# Patient Record
Sex: Female | Born: 1980 | Race: White | Hispanic: No | Marital: Single | Smoking: Never smoker
Health system: Southern US, Community
[De-identification: ages and names within clinical notes are randomized; demographics above are authoritative.]

## PROBLEM LIST (undated history)

## (undated) DIAGNOSIS — G809 Cerebral palsy, unspecified: Secondary | ICD-10-CM

## (undated) DIAGNOSIS — R569 Unspecified convulsions: Secondary | ICD-10-CM

## (undated) HISTORY — PX: DENTAL SURGERY: SHX609

---

## 1998-07-26 ENCOUNTER — Emergency Department (HOSPITAL_COMMUNITY): Admission: EM | Admit: 1998-07-26 | Discharge: 1998-07-26 | Payer: Self-pay | Admitting: Emergency Medicine

## 1998-07-26 ENCOUNTER — Encounter: Payer: Self-pay | Admitting: Emergency Medicine

## 1998-07-27 ENCOUNTER — Encounter: Payer: Self-pay | Admitting: Emergency Medicine

## 2000-02-14 ENCOUNTER — Other Ambulatory Visit: Admission: RE | Admit: 2000-02-14 | Discharge: 2000-02-14 | Payer: Self-pay | Admitting: Family Medicine

## 2000-02-21 ENCOUNTER — Inpatient Hospital Stay (HOSPITAL_COMMUNITY): Admission: EM | Admit: 2000-02-21 | Discharge: 2000-02-23 | Payer: Self-pay | Admitting: Family Medicine

## 2003-12-22 ENCOUNTER — Emergency Department (HOSPITAL_COMMUNITY): Admission: EM | Admit: 2003-12-22 | Discharge: 2003-12-22 | Payer: Self-pay | Admitting: Emergency Medicine

## 2004-01-20 ENCOUNTER — Ambulatory Visit (HOSPITAL_COMMUNITY): Admission: RE | Admit: 2004-01-20 | Discharge: 2004-01-20 | Payer: Self-pay | Admitting: Oral Surgery

## 2004-03-03 ENCOUNTER — Ambulatory Visit: Payer: Self-pay | Admitting: Family Medicine

## 2004-09-02 ENCOUNTER — Ambulatory Visit: Payer: Self-pay | Admitting: Family Medicine

## 2005-03-18 ENCOUNTER — Ambulatory Visit: Payer: Self-pay | Admitting: Family Medicine

## 2005-11-30 ENCOUNTER — Ambulatory Visit: Payer: Self-pay | Admitting: Internal Medicine

## 2006-03-22 ENCOUNTER — Ambulatory Visit: Payer: Self-pay | Admitting: Internal Medicine

## 2007-01-05 ENCOUNTER — Encounter: Payer: Self-pay | Admitting: Internal Medicine

## 2007-02-01 ENCOUNTER — Ambulatory Visit: Payer: Self-pay | Admitting: Internal Medicine

## 2007-02-01 DIAGNOSIS — G809 Cerebral palsy, unspecified: Secondary | ICD-10-CM | POA: Insufficient documentation

## 2007-02-01 DIAGNOSIS — G40401 Other generalized epilepsy and epileptic syndromes, not intractable, with status epilepticus: Secondary | ICD-10-CM

## 2007-02-01 DIAGNOSIS — J309 Allergic rhinitis, unspecified: Secondary | ICD-10-CM | POA: Insufficient documentation

## 2007-02-02 ENCOUNTER — Telehealth (INDEPENDENT_AMBULATORY_CARE_PROVIDER_SITE_OTHER): Payer: Self-pay | Admitting: *Deleted

## 2007-02-06 ENCOUNTER — Encounter: Payer: Self-pay | Admitting: Internal Medicine

## 2007-02-20 ENCOUNTER — Encounter: Payer: Self-pay | Admitting: Internal Medicine

## 2007-05-01 ENCOUNTER — Ambulatory Visit: Payer: Self-pay | Admitting: Internal Medicine

## 2007-07-23 ENCOUNTER — Telehealth (INDEPENDENT_AMBULATORY_CARE_PROVIDER_SITE_OTHER): Payer: Self-pay | Admitting: *Deleted

## 2007-10-09 ENCOUNTER — Encounter: Payer: Self-pay | Admitting: Internal Medicine

## 2007-11-06 ENCOUNTER — Telehealth (INDEPENDENT_AMBULATORY_CARE_PROVIDER_SITE_OTHER): Payer: Self-pay | Admitting: *Deleted

## 2007-11-07 ENCOUNTER — Encounter: Payer: Self-pay | Admitting: Internal Medicine

## 2008-02-11 ENCOUNTER — Telehealth (INDEPENDENT_AMBULATORY_CARE_PROVIDER_SITE_OTHER): Payer: Self-pay | Admitting: *Deleted

## 2008-02-15 ENCOUNTER — Telehealth (INDEPENDENT_AMBULATORY_CARE_PROVIDER_SITE_OTHER): Payer: Self-pay | Admitting: *Deleted

## 2008-02-15 ENCOUNTER — Encounter: Payer: Self-pay | Admitting: Internal Medicine

## 2008-02-22 ENCOUNTER — Telehealth (INDEPENDENT_AMBULATORY_CARE_PROVIDER_SITE_OTHER): Payer: Self-pay | Admitting: *Deleted

## 2008-02-26 ENCOUNTER — Ambulatory Visit: Payer: Self-pay | Admitting: Internal Medicine

## 2008-02-26 DIAGNOSIS — D2330 Other benign neoplasm of skin of unspecified part of face: Secondary | ICD-10-CM

## 2008-03-23 ENCOUNTER — Emergency Department (HOSPITAL_BASED_OUTPATIENT_CLINIC_OR_DEPARTMENT_OTHER): Admission: EM | Admit: 2008-03-23 | Discharge: 2008-03-23 | Payer: Self-pay | Admitting: Emergency Medicine

## 2008-03-27 ENCOUNTER — Telehealth (INDEPENDENT_AMBULATORY_CARE_PROVIDER_SITE_OTHER): Payer: Self-pay | Admitting: *Deleted

## 2008-03-31 ENCOUNTER — Encounter: Payer: Self-pay | Admitting: Internal Medicine

## 2008-07-28 ENCOUNTER — Telehealth (INDEPENDENT_AMBULATORY_CARE_PROVIDER_SITE_OTHER): Payer: Self-pay | Admitting: *Deleted

## 2008-08-27 ENCOUNTER — Telehealth (INDEPENDENT_AMBULATORY_CARE_PROVIDER_SITE_OTHER): Payer: Self-pay | Admitting: *Deleted

## 2008-09-29 ENCOUNTER — Telehealth (INDEPENDENT_AMBULATORY_CARE_PROVIDER_SITE_OTHER): Payer: Self-pay | Admitting: *Deleted

## 2008-09-30 ENCOUNTER — Encounter: Payer: Self-pay | Admitting: Internal Medicine

## 2008-11-19 ENCOUNTER — Telehealth (INDEPENDENT_AMBULATORY_CARE_PROVIDER_SITE_OTHER): Payer: Self-pay | Admitting: *Deleted

## 2008-12-29 ENCOUNTER — Telehealth (INDEPENDENT_AMBULATORY_CARE_PROVIDER_SITE_OTHER): Payer: Self-pay | Admitting: *Deleted

## 2009-01-09 ENCOUNTER — Telehealth (INDEPENDENT_AMBULATORY_CARE_PROVIDER_SITE_OTHER): Payer: Self-pay | Admitting: *Deleted

## 2009-06-29 ENCOUNTER — Encounter: Payer: Self-pay | Admitting: Internal Medicine

## 2010-05-06 ENCOUNTER — Emergency Department (HOSPITAL_COMMUNITY)
Admission: EM | Admit: 2010-05-06 | Discharge: 2010-05-06 | Payer: Self-pay | Source: Home / Self Care | Admitting: Emergency Medicine

## 2010-06-22 NOTE — Letter (Signed)
Summary: [facility]  [facility]   Imported By: Lanelle Bal 07/21/2009 10:45:03  _____________________________________________________________________  External Attachment:    Type:   Image     Comment:   External Document

## 2010-08-02 LAB — URINE CULTURE
Colony Count: NO GROWTH
Culture  Setup Time: 201112150840

## 2010-08-02 LAB — VALPROIC ACID LEVEL: Valproic Acid Lvl: 115.9 ug/mL — ABNORMAL HIGH (ref 50.0–100.0)

## 2010-08-02 LAB — BASIC METABOLIC PANEL
BUN: 19 mg/dL (ref 6–23)
CO2: 19 mEq/L (ref 19–32)
Calcium: 9 mg/dL (ref 8.4–10.5)
Chloride: 108 mEq/L (ref 96–112)
Creatinine, Ser: 0.83 mg/dL (ref 0.4–1.2)
GFR calc Af Amer: >60 mL/min (ref >60)
GFR calc non Af Amer: >60 mL/min (ref >60)
Glucose, Bld: 170 mg/dL — ABNORMAL HIGH (ref 70–99)
Potassium: 2.8 mEq/L — ABNORMAL LOW (ref 3.5–5.1)
Sodium: 137 mEq/L (ref 135–145)

## 2010-08-02 LAB — CBC
MCV: 96.1 fL (ref 78.0–100.0)
Platelets: 174 10*3/uL (ref 150–400)
RBC: 3.89 MIL/uL (ref 3.87–5.11)
RDW: 13.6 % (ref 11.5–15.5)
WBC: 10.8 10*3/uL — ABNORMAL HIGH (ref 4.0–10.5)

## 2010-08-02 LAB — DIFFERENTIAL
Basophils Absolute: 0 10*3/uL (ref 0.0–0.1)
Lymphocytes Relative: 5 % — ABNORMAL LOW (ref 12–46)
Lymphs Abs: 0.6 10*3/uL — ABNORMAL LOW (ref 0.7–4.0)
Neutro Abs: 9.2 10*3/uL — ABNORMAL HIGH (ref 1.7–7.7)
Neutrophils Relative %: 85 % — ABNORMAL HIGH (ref 43–77)

## 2010-08-02 LAB — URINALYSIS, ROUTINE W REFLEX MICROSCOPIC
Glucose, UA: NEGATIVE mg/dL
Hgb urine dipstick: NEGATIVE
Ketones, ur: 15 mg/dL — AB
Protein, ur: NEGATIVE mg/dL
pH: 6 (ref 5.0–8.0)

## 2010-10-08 NOTE — Discharge Summary (Signed)
[facility]  Patient:    Michelle Oconnor, Michelle Oconnor                       MRN: [id number] Proc. Date: 02/23/00 Adm. Date:  [id number] Disc. Date: 02/23/00 Attending:  Angelena Sole CC:         Angelena Sole, M.D. [facility]   Discharge Summary  DISCHARGE DIAGNOSES: 1. Oral mucosal inflammation. 2. Dehydration secondary to above. 3. Cerebral palsy with known seizure disorder. 4. Allergic rhinitis.  DISCHARGE MEDICATIONS: 1. Majic mouthwash 5 cc swish and spit q.i.d. 2. She also takes Keppra 500 mg one and a half tablets three times daily. 3. Depakote 125 mg t.i.d. 4. Lorazepam 0.5 mg p.o. q.d. p.r.n. 5. Allegra 60 mg p.o. q.d. p.r.n.  CONDITION ON DISCHARGE:  Improved (the patient needs total care).  FOLLOWUP PLANS:  Dr. Ruthine Dose one to two weeks.  HOSPITAL LABORATORY:  ASO titer 90 (normal 0-199).  Sedimentation rate 25. CMET normal except for a glucose of 121 and a total protein of 8.3.  CBC normal except for a white count of 11.6.  Hemoglobin 13.2, platelet count 289,000.  Valproic acid level 48.3 (therapeutic range 50-100).  HOSPITAL COURSE: #1 - ORAL MUCOSAL INFLAMMATION:  The patient was admitted to the hospital service on February 21, 2000, with oral mucosal and inflammation of the gums. She was unable to eat or drink and was dehydrated.  The patient was admitted for hydration.  Her mouth was treated with Majic mouthwash because of her oral mucosal inflammation that was thought to be related to a recent treatment with doxycycline.  Her symptoms improved on Majic mouthwash.  She was tolerating her diet at the time of discharge.  #2 - GASTROINTESTINAL:  The patient was constipated on admission and was given laxative on admission.  The day of discharge the patient had a bowel movement with some bright red blood per rectum per the nurse and mother.  On examination, there were no external hemorrhoids and no fissure was identified. It is unclear the cause of this.   It may need further evaluation.  I suppose it is possible that the same reactions she had with her oral mucosa from the doxycycline could also have affected her intestinal mucosa.  Her mother will watch this as a outpatient.  If it persists, she will call Dr. Ruthine Dose.  Prior to discharge I will obtain a CBC. DD:  02/23/00 TD:  02/23/00 Job: [id number]

## 2010-10-08 NOTE — Op Note (Signed)
Michelle Oconnor, PROCELL                          ACCOUNT NO.:  [id number]   MEDICAL RECORD NO.:  [id number]                   PATIENT TYPE:  OIB   LOCATION:  2550                                 FACILITY:  [facility]   PHYSICIAN:  Dora Sims, M.D.               DATE OF BIRTH:  1981-01-23   DATE OF PROCEDURE:  01/20/2004  DATE OF DISCHARGE:  01/20/2004                                 OPERATIVE REPORT   PREOPERATIVE DIAGNOSIS:  Periodontal disease, dental decay.   POSTOPERATIVE DIAGNOSIS:  Periodontal disease, dental decay.   OPERATION PERFORMED:  Examination of oral cavity under general anesthesia,  extraction of decayed and periodontally involved teeth.   SURGEON:  1.  Dora Sims, M.D.  2.  Vivianne Spence, D.D.S.   INDICATIONS FOR PROCEDURE:  This is a 30 year old Caucasian with cerebral  palsy.  She has difficulty with the ability to cooperate for dental  examination and dental extraction, so decision was made with mother to bring  the patient to the operating room for evaluation and extraction.   DESCRIPTION OF PROCEDURE:  The patient was maintained n.p.o. the night  before surgery, brought to the operating room, placed in a supine position.  All anesthesia monitors were found to be working appropriately.  The patient  was orotracheally intubated with minimal difficulty.  This was confirmed by  clear bilateral breath sounds as well as positive end tidal CO2.  Once this  was done, Dr. Sudie Bailey did an intraoral evaluation of the patient's existing  dentition and he and I conferred on the extraction of teeth numbers 1, 16,  17, 29, 31, and 32, if all third molars were indeed in their respective  locations.  Dr. Sudie Bailey then left the operating room.  2% lidocaine with  1:100,000 parts epinephrine was injected into the maxillary vestibules in  teeth numbers 1 and 16 areas and bilateral inferior alveolar nerve blocks  were given as well as vestibular infiltration at the number 31,  32 area and  17, 18 area.  On evaluation, it was found that palatally, tooth #1 was  visible. A small flap was elevated off the distal of #2, tooth #1 was  luxated and delivered with forceps.  This area was closed with 3-0 chromic  gut suture.  Attention was turned focused on the lower right quadrant.  Tooth #29 was luxated and delivered with forceps as was tooth #31.  It was  clear from this after removal of the soft tissue col, the crown of tooth  #32, a distal flap was then elevated in the traditional buccal hockey stitch  fashion.  Irrigating hand piece was used to remove buccal cortex and the  tooth was luxated and delivered with forceps.  The follicle was removed and  the flap was closed using interrupted 3-0 chromic gut sutures.  This side  was packed with gauze, the orotracheal tube was switched to the  contralateral side and bite block was placed.  Palpation on the distal of  tooth #15 was tactilely evident that tooth #16 was there.  A distal release  was made.  Tooth #16 was luxated and delivered with forceps and the socket  was closed with 3-0 chromic gut suture.  A similar tactile procedure was  used in this case with the dental syringe needle.  It was tactilely evident  that beneath the gingiva was enamel and not bone.  Distal  buccal hockey  stick flap was elevated exposing tooth #17.  Irrigating handpiece was used  to remove buccal bone.  The tooth was luxated and delivered with forceps.  The follicle was removed and the socket was irrigated and closed with 3-0  chromic gut suture in an interrupted fashion.  These areas were packed with  gauze and the patient's mouth was suctioned of all blood and secretions.  A  [place] sump was passed with minimal blood.  The patient was then allowed to  awaken from general anesthesia.  She was extubated with minimal difficulty  and the packing was removed prior to the patient's waking up due to her  mental status.  The  patient tolerated  the procedure well.  Minimal blood was lost.  No drains  were placed.  Nothing was sent for pathology.  Teeth numbers 1, 16, 17, 29,  31 and 32 were extracted.  She will be followed up in my office, placed on  Vicodin pain medicine elixir as well as Peridex antibiotic mouth rinse, and  seen in approximately  10 days.                                               Dora Sims, M.D.    RJR/MEDQ  D:  01/20/2004  T:  01/20/2004  Job:  [id number]

## 2011-09-07 ENCOUNTER — Encounter (HOSPITAL_COMMUNITY): Payer: Self-pay | Admitting: Pharmacy Technician

## 2011-09-15 NOTE — Consult Note (Signed)
This is a 73 y/0 white female with severe cerebral palsy and seizure disorder.  She is very difficult to examine in the office an is not manageable in the office.  She has presented with an abscessed tooth on the lower right side.  The treatment plan is to remove it under general anesthesia.  At the same time Dr. Vivianne Spence will come in to the OR and see if anything else needs to be done

## 2011-09-19 ENCOUNTER — Encounter (HOSPITAL_COMMUNITY)
Admission: RE | Admit: 2011-09-19 | Discharge: 2011-09-19 | Disposition: A | Discharge status: Home / Self Care | Payer: Medicare Other | Source: Ambulatory Visit | Attending: Oral Surgery | Admitting: Oral Surgery

## 2011-09-19 ENCOUNTER — Encounter (HOSPITAL_COMMUNITY): Payer: Self-pay

## 2011-09-19 HISTORY — DX: Unspecified convulsions: R56.9

## 2011-09-19 HISTORY — DX: Cerebral palsy, unspecified: G80.9

## 2011-09-19 NOTE — H&P (Signed)
Michelle Oconnor is an 31 y.o. female.   Chief Complaint: Abscessed tooth  HPI: recent swelling, pain    CP  No past medical history on file.  No past surgical history on file.  No family history on file. Social History:  does not have a smoking history on file. She does not have any smokeless tobacco history on file. Her alcohol and drug histories not on file.  Allergies:  Allergies  Allergen Reactions  . Doxycycline Hyclate     REACTION: tongue swelling    No prescriptions prior to admission    No results found for this or any previous visit (from the past 48 hour(s)). No results found.  ROS  There were no vitals taken for this visit. Physical Exam  HENT:  Mouth/Throat:       Assessment/Plan Removal of infected tooth and detailed exam with Dr. Vivianne Spence.  Other teeth may be removed at the same time  [place]ia Southgate,JOSEPH L 09/19/2011, 9:10 AM

## 2011-09-19 NOTE — Pre-Procedure Instructions (Signed)
20 Michelle Oconnor  09/19/2011   Your procedure is scheduled on:  09/26/11  Report to [facility] at 530 AM.  Call this number if you have problems the morning of surgery: [phone]   Remember:   Do not eat food:After Midnight.  May have clear liquids: up to 4 Hours before arrival.  Clear liquids include soda, tea, black coffee, apple or grape juice, broth.  Take these medicines the morning of surgery with A SIP OF WATER: zyrtec,depakote,ativan   Do not wear jewelry, make-up or nail polish.  Do not wear lotions, powders, or perfumes. You may wear deodorant.  Do not shave 48 hours prior to surgery.  Do not bring valuables to the hospital.  Contacts, dentures or bridgework may not be worn into surgery.  Leave suitcase in the car. After surgery it may be brought to your room.  For patients admitted to the hospital, checkout time is 11:00 AM the day of discharge.   Patients discharged the day of surgery will not be allowed to drive home.  Name and phone number of your driver: family  Special Instructions: CHG Shower Use Special Wash: 1/2 bottle night before surgery and 1/2 bottle morning of surgery.   Please read over the following fact sheets that you were given: Pain Booklet, Coughing and Deep Breathing and Surgical Site Infection Prevention

## 2011-09-19 NOTE — Progress Notes (Signed)
No lab work necessary per anesthesia.

## 2011-09-19 NOTE — Progress Notes (Signed)
Michelle Oconnor p.a  to review h&p,meds am of surgery and lab.

## 2011-09-19 NOTE — Consult Note (Addendum)
Anesthesia Consult:  Patient is a 31 year old female scheduled for dental extractions under anesthesia by Dr. Hyacinth Meeker on 09/26/11.  Her past medical history is significant for cerebral palsy and seizures.  Her only prior surgical procedure is a dental procedure in 2005.  Her Neurologist is Dr. Arville Go at the [facility] [phone]).  Her PCP is Dr. Wylene Simmer.    I saw patient earlier today during her PAT appointment.  She was with her mom Lucynda Rosano [phone] cell).  Mom says Bonita is in her usual state of health.  She does continue to have 7-9 seizures/month--last was on 09/15/11.  Mom says this is good for Lakedra because prior to her current medication regimen she was having over 30 seizures per month.  Seizures are described as grand mal, lasting up to ~ 3 minutes.  She has standing orders to give Lorazepam 1 mg PO if a seizure develops and Diastat 10 mg rectally if it progresses to a third seizure.  Mom says she can usually tell when her daughter is getting ready to have a seizure, as she will be unusually hungry.    Her scheduled anti-seizure medications include Depakote Sprinkles 750 mg po BID and Zonegran 200 mg po BID.  These are given in applesauce at 0730 and 1930.  She is also on Zyrtec, lactulose, and Robinul 1 mg TID (usually only need BID per mom) for excessive drooling.  Since patient will be NPO, mom has concerns regarding how Jataya's medications can be administered and in a timely fashion.  I've called Dr. Diamantina Providence office regarding possible suggestions and spoke with her nurse Arline Asp who will get back in touch with me once she gets an answer from Dr. August Saucer.  Exam findings showed a young women in a wheelchair.  She was calm, non-verbal, in no apparent distress.  Lungs sounds were diminished at the bases, but overall sounded clear.  Heart had a RRR.  I did not appreciate a murmur.  VSS.  O2 sat 100%.  At this time, will not plan for pre-operative labs from an Anesthesia  standpoint (Dr. Rondel Baton orders are still pending).  Mom was instructed to call Short Stay A on the morning of surgery if patient does exhibit any seizure activity as this may indicate that her surgery should be canceled or delayed.  A final decision on whether or not to proceed would be made by her assigned Anesthesiologist depending patient's symptomology, etc.  I reviewed above with Anesthesiologist Dr. Sampson Goon.  He agrees with the above plan.  I'll follow-up once I've heard back from Dr. August Saucer.  I've asked the nursing staff to call for her pre-operative H&P.  Addendum: 09/20/11 1500  I received a phone call from patient's Neurologist, Dr. Arville Go.  She recommended that since Alyson would be NPO prior to her procedure, that she be given 1000 mg IV Depakon bolus in the Holding area once IV access is established.  I have ordered this and spoke with a Pharmacist named Jonny Ruiz who reports that if the order is released when the patient arrives, it should be available for administration by 0700.  ([facility] recommends infusing at 44ml/hr and not exceeding over 20 mg/min.)  Dr. August Saucer says it should not cause any significant changes in patients heart or respiratory rate.  She said both the Zonegran and oral Depakote Sprinkle could still be given in the PACU once Ruqaya is alert enough to take po's.  For any perioperative seizure activity she would recommend  Ativan 1 mg IV.  Dr. August Saucer gave her cell number [phone]) if anyone needs to call her during Ms. Pherigo perioperative period.  Dr. August Saucer was going to call mom Juno Alers to discuss these instructions as well.     Shonna Chock, PA-C

## 2011-09-25 MED ORDER — CEFAZOLIN SODIUM 1-5 GM-% IV SOLN
1.0000 g | INTRAVENOUS | Status: AC
  Administered 2011-09-26: 1 g via INTRAVENOUS
  Filled 2011-09-25: qty 50

## 2011-09-25 MED ORDER — VALPROATE SODIUM 500 MG/5ML IV SOLN
1000.0000 mg | Freq: Once | INTRAVENOUS | Status: AC
  Administered 2011-09-26: 1 g via INTRAVENOUS
  Filled 2011-09-25: qty 10

## 2011-09-26 ENCOUNTER — Ambulatory Visit (HOSPITAL_COMMUNITY)
Admission: RE | Admit: 2011-09-26 | Discharge: 2011-09-26 | Disposition: A | Discharge status: Home / Self Care | Payer: Medicare Other | Source: Ambulatory Visit | Attending: Oral Surgery | Admitting: Oral Surgery

## 2011-09-26 ENCOUNTER — Encounter (HOSPITAL_COMMUNITY): Payer: Self-pay | Admitting: *Deleted

## 2011-09-26 ENCOUNTER — Encounter (HOSPITAL_COMMUNITY): Payer: Self-pay | Admitting: Vascular Surgery

## 2011-09-26 ENCOUNTER — Encounter (HOSPITAL_COMMUNITY)
Admission: RE | Disposition: A | Discharge status: Home / Self Care | Payer: Self-pay | Source: Ambulatory Visit | Attending: Oral Surgery

## 2011-09-26 ENCOUNTER — Ambulatory Visit (HOSPITAL_COMMUNITY): Payer: Medicare Other | Admitting: Vascular Surgery

## 2011-09-26 DIAGNOSIS — K056 Periodontal disease, unspecified: Secondary | ICD-10-CM

## 2011-09-26 DIAGNOSIS — K047 Periapical abscess without sinus: Secondary | ICD-10-CM | POA: Insufficient documentation

## 2011-09-26 HISTORY — PX: EXAMINATION UNDER ANESTHESIA: SHX1540

## 2011-09-26 HISTORY — PX: TOOTH EXTRACTION: SHX859

## 2011-09-26 SURGERY — EXTRACTION, TOOTH, MOLAR
Anesthesia: General | Site: Mouth | Wound class: Clean Contaminated

## 2011-09-26 MED ORDER — OXYMETAZOLINE HCL 0.05 % NA SOLN
NASAL | Status: DC | PRN
  Administered 2011-09-26: 1

## 2011-09-26 MED ORDER — DEXAMETHASONE SODIUM PHOSPHATE 4 MG/ML IJ SOLN
INTRAMUSCULAR | Status: DC | PRN
  Administered 2011-09-26: 4 mg via INTRAVENOUS

## 2011-09-26 MED ORDER — ONDANSETRON HCL 4 MG/2ML IJ SOLN
INTRAMUSCULAR | Status: DC | PRN
  Administered 2011-09-26: 4 mg via INTRAVENOUS

## 2011-09-26 MED ORDER — FENTANYL CITRATE 0.05 MG/ML IJ SOLN: 25.0000 ug | INTRAMUSCULAR | Status: DC | PRN

## 2011-09-26 MED ORDER — PROPOFOL 10 MG/ML IV EMUL
INTRAVENOUS | Status: DC | PRN
  Administered 2011-09-26: 150 mg via INTRAVENOUS

## 2011-09-26 MED ORDER — LIDOCAINE-EPINEPHRINE 2 %-1:100000 IJ SOLN
INTRAMUSCULAR | Status: DC | PRN
  Administered 2011-09-26 (×2): 1.7 mL

## 2011-09-26 MED ORDER — 0.9 % SODIUM CHLORIDE (POUR BTL) OPTIME
TOPICAL | Status: DC | PRN
  Administered 2011-09-26: 1000 mL

## 2011-09-26 MED ORDER — HYDROCODONE-ACETAMINOPHEN 7.5-500 MG/15ML PO SOLN: 15.0000 mL | Freq: Four times a day (QID) | ORAL | Status: AC | PRN

## 2011-09-26 MED ORDER — LACTATED RINGERS IV SOLN
INTRAVENOUS | Status: DC | PRN
  Administered 2011-09-26: 08:00:00 via INTRAVENOUS

## 2011-09-26 SURGICAL SUPPLY — 38 items
ALCOHOL 70% 16 OZ (MISCELLANEOUS) ×2 IMPLANT
BLADE SURG 15 STRL LF DISP TIS (BLADE) ×1 IMPLANT
BLADE SURG 15 STRL SS (BLADE) ×2
BUR CROSS CUT (BURR) ×1 IMPLANT
BUR CROSS CUT FISSURE 1.6 (BURR) ×1 IMPLANT
CANISTER SUCTION 2500CC (MISCELLANEOUS) ×2 IMPLANT
CLOTH BEACON ORANGE TIMEOUT ST (SAFETY) ×2 IMPLANT
COVER SURGICAL LIGHT HANDLE (MISCELLANEOUS) ×2 IMPLANT
GAUZE PACKING FOLDED 2  STR (GAUZE/BANDAGES/DRESSINGS) ×1
GAUZE PACKING FOLDED 2 STR (GAUZE/BANDAGES/DRESSINGS) ×1 IMPLANT
GAUZE SPONGE 4X4 16PLY XRAY LF (GAUZE/BANDAGES/DRESSINGS) ×2 IMPLANT
GLOVE BIO SURGEON STRL SZ 6.5 (GLOVE) ×4 IMPLANT
GLOVE BIOGEL PI IND STRL 6.5 (GLOVE) IMPLANT
GLOVE BIOGEL PI IND STRL 8 (GLOVE) ×1 IMPLANT
GLOVE BIOGEL PI INDICATOR 6.5 (GLOVE) ×1
GLOVE BIOGEL PI INDICATOR 8 (GLOVE) ×1
GLOVE ECLIPSE 7.5 STRL STRAW (GLOVE) ×2 IMPLANT
GLOVE SS BIOGEL STRL SZ 6.5 (GLOVE) IMPLANT
GLOVE SUPERSENSE BIOGEL SZ 6.5 (GLOVE) ×1
GLOVE SURG SS PI 7.5 STRL IVOR (GLOVE) ×1 IMPLANT
GOWN STRL NON-REIN LRG LVL3 (GOWN DISPOSABLE) ×5 IMPLANT
KIT ROOM TURNOVER OR (KITS) ×2 IMPLANT
MARKER SKIN DUAL TIP RULER LAB (MISCELLANEOUS) ×1 IMPLANT
NDL BLUNT 16X1.5 OR ONLY (NEEDLE) ×1 IMPLANT
NDL DENTAL 27 LONG (NEEDLE) IMPLANT
NEEDLE 27GAX1X1/2 (NEEDLE) ×2 IMPLANT
NEEDLE BLUNT 16X1.5 OR ONLY (NEEDLE) IMPLANT
NEEDLE DENTAL 27 LONG (NEEDLE) ×2 IMPLANT
NS IRRIG 1000ML POUR BTL (IV SOLUTION) ×2 IMPLANT
PACK EENT II TURBAN DRAPE (CUSTOM PROCEDURE TRAY) ×2 IMPLANT
PAD ARMBOARD 7.5X6 YLW CONV (MISCELLANEOUS) ×3 IMPLANT
SPONGE GAUZE 4X4 12PLY (GAUZE/BANDAGES/DRESSINGS) ×1 IMPLANT
SUT CHROMIC 3 0 PS 2 (SUTURE) ×3 IMPLANT
SYR 50ML SLIP (SYRINGE) ×2 IMPLANT
TUBE CONNECTING 12X1/4 (SUCTIONS) ×2 IMPLANT
TUBING IRRIGATION (MISCELLANEOUS) IMPLANT
WATER STERILE IRR 1000ML POUR (IV SOLUTION) ×1 IMPLANT
YANKAUER SUCT BULB TIP NO VENT (SUCTIONS) ×2 IMPLANT

## 2011-09-26 NOTE — Op Note (Signed)
The patient was brought to the operating room in a supine which remained throughout the whole procedure. Intubated via right nasoendotracheal tube. She was draped and prepped in the usual fashion for an intraoral procedure. Dr. Franchot Heidelberg examined the patient and felt that #19 and #30  Were the 2 teeth that needed to be removed. The throat pack was placed. 2 carpules of 2% Xylocaine with 1-100,000 epinephrine was given as a block bilaterally. A perosteal elevator  went around #30. It was mobilized with an 11-A. elevator and removed with a lower universal forceps. The socket was trimmed with a rongeur. It was curetted and irrigated. The soft tissue was closed with a 3-0 chromic suture. A periosteal elevator went around #19. It was sectioned with a crosscut fissure bur. A purchase point was placed in the mesial root. It was removed with a Education officer, museum. A second purchase point was placed in the distal root. It was elevated using a Education officer, museum. The bone was trimmed. The socket was curetted and irrigated. The soft tissue was closed with a 3-0 chromic suture. The throat pack was removed after irrigating and suctioning the mouth. Gauze packs were placed over the surgical sites. The patient was extubated on the table and returned to the recovery room in good condition. The patient will be sent home on pain medication and will be followed by me in my private office.

## 2011-09-26 NOTE — Anesthesia Postprocedure Evaluation (Signed)
  Anesthesia Post-op Note  Patient: Michelle Oconnor  Procedure(s) Performed: Procedure(s) (LRB): EXTRACTION MOLARS (N/A) EXAM UNDER ANESTHESIA (N/A)  Patient Location: PACU  Anesthesia Type: General  Level of Consciousness: awake and alert   Airway and Oxygen Therapy: Patient Spontanous Breathing  Post-op Pain: none  Post-op Assessment: Post-op Vital signs reviewed, Patient's Cardiovascular Status Stable, Respiratory Function Stable, Patent Airway, No signs of Nausea or vomiting and Pain level controlled  Post-op Vital Signs: Reviewed and stable  Complications: No apparent anesthesia complications

## 2011-09-26 NOTE — Brief Op Note (Signed)
09/26/2011  8:41 AM  PATIENT:  Michelle Oconnor  31 y.o. female  PRE-OPERATIVE DIAGNOSIS:  INFFECTED TOOTH ABCESS  POST-OPERATIVE DIAGNOSIS:  INFFECTED TOOTH ABCESS  PROCEDURE:  Procedure(s) (LRB): EXTRACTION MOLARS (N/A) EXAM UNDER ANESTHESIA (N/A)  SURGEON:  Surgeon(s) and Role:    * Hinton Dyer, DDS - Primary    * Monica Martinez, DDS  PHYSICIAN ASSISTANT: Not applicable  ASSISTANTS:  Hadassah Pais  ANESTHESIA: Exam Under anesthesia  EBL:  Total I/O In: -  Out: 20 [Blood:20]  BLOOD ADMINISTERED:none  DRAINS: none   LOCAL MEDICATIONS USED:  XYLOCAINE   SPECIMEN:  teeth  DISPOSITION OF SPECIMEN:  N/A  COUNTS:  YES  TOURNIQUET:  * No tourniquets in log *  DICTATION: .Dragon Dictation  PLAN OF CARE: Discharge to home after PACU  PATIENT DISPOSITION:  PACU - hemodynamically stable.   Delay start of Pharmacological VTE agent (>24hrs) due to surgical blood loss or risk of bleeding: not applicable

## 2011-09-26 NOTE — Progress Notes (Signed)
NOTIFIED DR CREWS OF PATIENT'S MOTHER STATING SHE WAS STARTED ON AMOXICILLIN Friday FOR CONGESTION BUT WAS NOT SEEN IN OFFICE. PATIENT HAS CP AND NOT ABLE TO UNDERSTAND TO TAKE DEEP BREATHS AND BREATH SOUNDS DECREASED, MOTHER STATES SHE SOUNDS BETTER.  DR CREWS STATED HE WOULD SPEAK WITH DR Jean Rosenthal AND IF AFTER EVALUATED IN HOLDING , THEY COULD GET CXR AS NEEDED.

## 2011-09-26 NOTE — Transfer of Care (Signed)
Immediate Anesthesia Transfer of Care Note  Patient: Michelle Oconnor  Procedure(s) Performed: Procedure(s) (LRB): EXTRACTION MOLARS (N/A) EXAM UNDER ANESTHESIA (N/A)  Patient Location: PACU  Anesthesia Type: General  Level of Consciousness: awake, oriented and patient cooperative  Airway & Oxygen Therapy: Patient Spontanous Breathing and Patient connected to nasal cannula oxygen  Post-op Assessment: Report given to PACU RN and Post -op Vital signs reviewed and stable  Post vital signs: Reviewed and stable  Complications: No apparent anesthesia complications

## 2011-09-26 NOTE — Progress Notes (Signed)
Patient ID: Michelle Oconnor, female   DOB: April 27, 1981, 31 y.o.   MRN: [id number] The chart was reviewed.  The patient had been placed on Amoxicillan for a cold.  She was cleared this morning by Anesthesia.  Both Dr. Sudie Bailey and I agree with the treatment plan.  He will assess if any other teeth need to be extracted.

## 2011-09-26 NOTE — Preoperative (Signed)
Beta Blockers   Reason not to administer Beta Blockers:Not Applicable 

## 2011-09-26 NOTE — Anesthesia Preprocedure Evaluation (Addendum)
Anesthesia Evaluation  Patient identified by MRN, date of birth, ID band Patient awake    Reviewed: Allergy & Precautions, H&P , NPO status , Patient's Chart, lab work & pertinent test results  History of Anesthesia Complications Negative for: history of anesthetic complications  Airway Mallampati: II TM Distance: >3 FB Neck ROM: Full    Dental  (+) Teeth Intact and Dental Advisory Given   Pulmonary Recent URI , Resolved,  breath sounds clear to auscultation  Pulmonary exam normal       Cardiovascular negative cardio ROS  Rhythm:Regular Rate:Normal     Neuro/Psych Seizures -, Poorly Controlled,  Cerebral Palsy    GI/Hepatic Neg liver ROS, GERD-  Medicated and Controlled,  Endo/Other  negative endocrine ROS  Renal/GU negative Renal ROS Bladder dysfunction      Musculoskeletal negative musculoskeletal ROS (+)   Abdominal   Peds  (+) mental retardation and Neurological problemCerebral palsy-extreme developmental delay   Hematology negative hematology ROS (+)   Anesthesia Other Findings Unable to examine thoroughly due to CP condition  Reproductive/Obstetrics negative OB ROS                        Anesthesia Physical Anesthesia Plan  ASA: III  Anesthesia Plan: General   Post-op Pain Management:    Induction: Inhalational  Airway Management Planned: Nasal ETT  Additional Equipment:   Intra-op Plan:   Post-operative Plan: Extubation in OR  Informed Consent: I have reviewed the patients History and Physical, chart, labs and discussed the procedure including the risks, benefits and alternatives for the proposed anesthesia with the patient or authorized representative who has indicated his/her understanding and acceptance.   Dental advisory given  Plan Discussed with: CRNA, Anesthesiologist and Surgeon  Anesthesia Plan Comments: (Plan routine monitors, GETA with inhalational  induction and naso-tracheal intubation )       Anesthesia Quick Evaluation

## 2011-09-26 NOTE — Consult Note (Signed)
History and physical reviewed.  Patient OK for planned procedure.  Sandford Craze, MD 07:25am

## 2011-09-27 ENCOUNTER — Encounter (HOSPITAL_COMMUNITY): Payer: Self-pay | Admitting: Oral Surgery

## 2012-02-17 ENCOUNTER — Emergency Department (HOSPITAL_BASED_OUTPATIENT_CLINIC_OR_DEPARTMENT_OTHER)
Admission: EM | Admit: 2012-02-17 | Discharge: 2012-02-17 | Disposition: A | Discharge status: Home / Self Care | Payer: Medicare Other | Attending: Emergency Medicine | Admitting: Emergency Medicine

## 2012-02-17 ENCOUNTER — Encounter (HOSPITAL_BASED_OUTPATIENT_CLINIC_OR_DEPARTMENT_OTHER): Payer: Self-pay | Admitting: *Deleted

## 2012-02-17 DIAGNOSIS — G40909 Epilepsy, unspecified, not intractable, without status epilepticus: Secondary | ICD-10-CM | POA: Insufficient documentation

## 2012-02-17 DIAGNOSIS — G809 Cerebral palsy, unspecified: Secondary | ICD-10-CM | POA: Insufficient documentation

## 2012-02-17 DIAGNOSIS — R569 Unspecified convulsions: Secondary | ICD-10-CM

## 2012-02-17 NOTE — ED Provider Notes (Signed)
History     CSN: [id number]  Arrival date & time 02/17/12  1714   First MD Initiated Contact with Patient 02/17/12 1728      Chief Complaint  Patient presents with  . Seizures    (Consider location/radiation/quality/duration/timing/severity/associated sxs/prior treatment) HPI Comments: Patient with history of CP presents with seizures. History is taken from mother. Patient typically will have clusters of seizures which are described as "grand mal". Typically she will have 3-4 and they respond well to lorazepam and Diastat. Today patient has had approximately 7 seizures that have not responded to typical medications. The child has been appropriately sleepy after seizures. Patient's neurologist told mother told to bring patient to emergency department for Depakote level. Otherwise patient has been in normal health. Level 5 caveat due to mental retardation.   The history is provided by a parent.    Past Medical History  Diagnosis Date  . Cerebral palsy   . Seizures     has seizures monthly.  Dr August Saucer   [phone]    is her         Past Surgical History  Procedure Date  . Dental surgery   . Tooth extraction 09/26/2011    Procedure: EXTRACTION MOLARS;  Surgeon: Hinton Dyer, DDS;  Location: [facility] OR;  Service: Oral Surgery;  Laterality: N/A;  EXTRACTION OF TOOTH # 30 AND POSSIBLE OTHER TEETH  . Examination under anesthesia 09/26/2011    Procedure: EXAM UNDER ANESTHESIA;  Surgeon: Hinton Dyer, DDS;  Location: [facility] OR;  Service: Oral Surgery;  Laterality: N/A;  By Dr. Sudie Bailey     No family history on file.  History  Substance Use Topics  . Smoking status: Never Smoker   . Smokeless tobacco: Not on file  . Alcohol Use: No    OB History    Grav Para Term Preterm Abortions TAB SAB Ect Mult Living                  Review of Systems  Unable to perform ROS: Other    Allergies  Doxycycline hyclate  Home Medications   Current Outpatient Rx  Name Route Sig Dispense Refill  .  SINGULAIR PO Oral Take by mouth.    . AMOXICILLIN-POT CLAVULANATE 600-42.9 MG/5ML PO SUSR Oral Take 600 mg by mouth 2 (two) times daily. Take for 7 days. First dose on 09/23/11    . CETIRIZINE HCL 10 MG PO TABS Oral Take 10 mg by mouth daily.    Marland Kitchen DIAZEPAM 10 MG RE GEL Rectal Place 10 mg rectally every 6 (six) hours as needed. Post-seizure if Lorazepam does not subside anxiety, per pt's mother    . DIVALPROEX SODIUM 125 MG PO CPSP Oral Take 750 mg by mouth 2 (two) times daily.    Marland Kitchen GLYCOPYRROLATE 1 MG PO TABS Oral Take 1 mg by mouth 3 (three) times daily.    Marland Kitchen LACTULOSE 10 GM/15ML PO SOLN Oral Take 30 g by mouth daily.    Marland Kitchen LORAZEPAM 1 MG PO TABS Oral Take 1 mg by mouth every 8 (eight) hours as needed. For anxiety    . ZONISAMIDE 100 MG PO CAPS Oral Take 200 mg by mouth 2 (two) times daily.      BP 103/77  Pulse 100  Temp 98.6 F (37 C) (Axillary)  Resp 18  Wt 98 lb (44.453 kg)  SpO2 100%  Physical Exam  Nursing note and vitals reviewed. Constitutional: She appears well-nourished. No distress.  HENT:  Head: Normocephalic and atraumatic.  Eyes: Conjunctivae normal are normal. Pupils are equal, round, and reactive to light. Right eye exhibits no discharge. Left eye exhibits no discharge.  Neck: Normal range of motion. Neck supple.  Cardiovascular: Normal rate, regular rhythm and normal heart sounds.   Pulmonary/Chest: Effort normal and breath sounds normal.  Abdominal: Soft. There is no tenderness.  Neurological: She is alert.       Unable to fully evaluate and complete neuro exam 2/2 CP  Skin: Skin is warm and dry.  Psychiatric:       Behavior at baseline    ED Course  Procedures (including critical care time)  Labs Reviewed  VALPROIC ACID LEVEL - Abnormal; Notable for the following:    Valproic Acid Lvl 134.1 (*)     All other components within normal limits   No results found.   1. Seizure     5:45 PM Patient seen and examined.   Vital signs reviewed and are as  follows: Filed Vitals:   02/17/12 1729  BP: 103/77  Pulse: 100  Temp: 98.6 F (37 C)  Resp: 18   I spoke with Dr. August Saucer, the patient's neurologist. Given no further seizures and normal exam, she recommends patient return home with close followup.  Parents informed of discussion. They will hold additional dose of Depakote tonight and resume in the morning. They were urged to call Dr. August Saucer or return to the emergency department with atypical seizure, prolonged seizure, or if they have any other concerns. They verbalized understanding and agree with plan.   MDM  Patient with seizure disorder. Depakote slightly supra therapeutic. Will hold dose tonight. Parents are comfortable with caring for patient at home. Return precautions given. Do not suspect infection given exam.         Renne Crigler, PA 02/17/12 2015

## 2012-02-17 NOTE — ED Notes (Signed)
Hx of seizures. Mom states she is having more than she usually does. Mom called her neurologist and was told to go to the ED and have her blood drawn to check her Depakote level.

## 2012-02-17 NOTE — ED Provider Notes (Signed)
Medical screening examination/treatment/procedure(s) were performed by non-physician practitioner and as supervising physician I was immediately available for consultation/collaboration.   Zianne Schubring B. Artis Beggs, MD 02/17/12 2345 

## 2012-02-28 ENCOUNTER — Encounter (HOSPITAL_COMMUNITY): Payer: Self-pay

## 2016-08-18 ENCOUNTER — Encounter (HOSPITAL_COMMUNITY): Payer: Self-pay

## 2016-08-18 ENCOUNTER — Emergency Department (HOSPITAL_COMMUNITY): Payer: Medicare Other

## 2016-08-18 ENCOUNTER — Inpatient Hospital Stay (HOSPITAL_COMMUNITY)
Admission: EM | Admit: 2016-08-18 | Discharge: 2016-08-26 | DRG: 871 | Disposition: A | Discharge status: Hospice | Payer: Medicare Other | Attending: Internal Medicine | Admitting: Internal Medicine

## 2016-08-18 DIAGNOSIS — Z9289 Personal history of other medical treatment: Secondary | ICD-10-CM

## 2016-08-18 DIAGNOSIS — J80 Acute respiratory distress syndrome: Secondary | ICD-10-CM | POA: Diagnosis not present

## 2016-08-18 DIAGNOSIS — J9600 Acute respiratory failure, unspecified whether with hypoxia or hypercapnia: Secondary | ICD-10-CM | POA: Diagnosis not present

## 2016-08-18 DIAGNOSIS — Z7401 Bed confinement status: Secondary | ICD-10-CM | POA: Diagnosis not present

## 2016-08-18 DIAGNOSIS — Z4659 Encounter for fitting and adjustment of other gastrointestinal appliance and device: Secondary | ICD-10-CM

## 2016-08-18 DIAGNOSIS — E871 Hypo-osmolality and hyponatremia: Secondary | ICD-10-CM | POA: Diagnosis present

## 2016-08-18 DIAGNOSIS — G40401 Other generalized epilepsy and epileptic syndromes, not intractable, with status epilepticus: Secondary | ICD-10-CM

## 2016-08-18 DIAGNOSIS — J189 Pneumonia, unspecified organism: Secondary | ICD-10-CM | POA: Diagnosis present

## 2016-08-18 DIAGNOSIS — E162 Hypoglycemia, unspecified: Secondary | ICD-10-CM | POA: Diagnosis present

## 2016-08-18 DIAGNOSIS — J9811 Atelectasis: Secondary | ICD-10-CM | POA: Diagnosis not present

## 2016-08-18 DIAGNOSIS — Z7189 Other specified counseling: Secondary | ICD-10-CM

## 2016-08-18 DIAGNOSIS — J9601 Acute respiratory failure with hypoxia: Secondary | ICD-10-CM | POA: Diagnosis present

## 2016-08-18 DIAGNOSIS — Z66 Do not resuscitate: Secondary | ICD-10-CM | POA: Diagnosis present

## 2016-08-18 DIAGNOSIS — A419 Sepsis, unspecified organism: Secondary | ICD-10-CM | POA: Diagnosis present

## 2016-08-18 DIAGNOSIS — E876 Hypokalemia: Secondary | ICD-10-CM | POA: Diagnosis present

## 2016-08-18 DIAGNOSIS — R6521 Severe sepsis with septic shock: Secondary | ICD-10-CM | POA: Diagnosis present

## 2016-08-18 DIAGNOSIS — Z79899 Other long term (current) drug therapy: Secondary | ICD-10-CM | POA: Diagnosis not present

## 2016-08-18 DIAGNOSIS — E8770 Fluid overload, unspecified: Secondary | ICD-10-CM | POA: Diagnosis not present

## 2016-08-18 DIAGNOSIS — G9341 Metabolic encephalopathy: Secondary | ICD-10-CM | POA: Diagnosis present

## 2016-08-18 DIAGNOSIS — D539 Nutritional anemia, unspecified: Secondary | ICD-10-CM | POA: Diagnosis present

## 2016-08-18 DIAGNOSIS — L8990 Pressure ulcer of unspecified site, unspecified stage: Secondary | ICD-10-CM | POA: Diagnosis not present

## 2016-08-18 DIAGNOSIS — Z881 Allergy status to other antibiotic agents status: Secondary | ICD-10-CM

## 2016-08-18 DIAGNOSIS — G809 Cerebral palsy, unspecified: Secondary | ICD-10-CM | POA: Diagnosis present

## 2016-08-18 DIAGNOSIS — D696 Thrombocytopenia, unspecified: Secondary | ICD-10-CM

## 2016-08-18 DIAGNOSIS — J90 Pleural effusion, not elsewhere classified: Secondary | ICD-10-CM

## 2016-08-18 DIAGNOSIS — I509 Heart failure, unspecified: Secondary | ICD-10-CM | POA: Diagnosis present

## 2016-08-18 DIAGNOSIS — I959 Hypotension, unspecified: Secondary | ICD-10-CM | POA: Diagnosis not present

## 2016-08-18 DIAGNOSIS — G40909 Epilepsy, unspecified, not intractable, without status epilepticus: Secondary | ICD-10-CM | POA: Diagnosis present

## 2016-08-18 DIAGNOSIS — J690 Pneumonitis due to inhalation of food and vomit: Secondary | ICD-10-CM | POA: Diagnosis not present

## 2016-08-18 DIAGNOSIS — T17990A Other foreign object in respiratory tract, part unspecified in causing asphyxiation, initial encounter: Secondary | ICD-10-CM | POA: Diagnosis not present

## 2016-08-18 DIAGNOSIS — R1310 Dysphagia, unspecified: Secondary | ICD-10-CM | POA: Diagnosis present

## 2016-08-18 DIAGNOSIS — G9340 Encephalopathy, unspecified: Secondary | ICD-10-CM | POA: Diagnosis not present

## 2016-08-18 DIAGNOSIS — Z515 Encounter for palliative care: Secondary | ICD-10-CM | POA: Diagnosis not present

## 2016-08-18 LAB — RESPIRATORY PANEL BY PCR
ADENOVIRUS-RVPPCR: NOT DETECTED
BORDETELLA PERTUSSIS-RVPCR: NOT DETECTED
CHLAMYDOPHILA PNEUMONIAE-RVPPCR: NOT DETECTED
CORONAVIRUS 229E-RVPPCR: NOT DETECTED
Coronavirus HKU1: NOT DETECTED
Coronavirus NL63: NOT DETECTED
Coronavirus OC43: NOT DETECTED
INFLUENZA B-RVPPCR: NOT DETECTED
Influenza A: NOT DETECTED
METAPNEUMOVIRUS-RVPPCR: NOT DETECTED
Mycoplasma pneumoniae: NOT DETECTED
PARAINFLUENZA VIRUS 2-RVPPCR: NOT DETECTED
Parainfluenza Virus 1: NOT DETECTED
Parainfluenza Virus 3: NOT DETECTED
Parainfluenza Virus 4: NOT DETECTED
RHINOVIRUS / ENTEROVIRUS - RVPPCR: NOT DETECTED
Respiratory Syncytial Virus: NOT DETECTED

## 2016-08-18 LAB — COMPREHENSIVE METABOLIC PANEL
ALBUMIN: 1.6 g/dL — AB (ref 3.5–5.0)
ALK PHOS: 59 U/L (ref 38–126)
ALT: 8 U/L — ABNORMAL LOW (ref 14–54)
ALT: 6 U/L — AB (ref 14–54)
AST: 18 U/L (ref 15–41)
AST: 20 U/L (ref 15–41)
Albumin: 2 g/dL — ABNORMAL LOW (ref 3.5–5.0)
Alkaline Phosphatase: 70 U/L (ref 38–126)
Anion gap: 8 (ref 5–15)
Anion gap: 7 (ref 5–15)
BUN: 20 mg/dL (ref 6–20)
BUN: 17 mg/dL (ref 6–20)
CHLORIDE: 105 mmol/L (ref 101–111)
CO2: 26 mmol/L (ref 22–32)
CO2: 24 mmol/L (ref 22–32)
CREATININE: 0.6 mg/dL (ref 0.44–1.00)
Calcium: 8.6 mg/dL — ABNORMAL LOW (ref 8.9–10.3)
Calcium: 7.9 mg/dL — ABNORMAL LOW (ref 8.9–10.3)
Chloride: 100 mmol/L — ABNORMAL LOW (ref 101–111)
Creatinine, Ser: 0.65 mg/dL (ref 0.44–1.00)
GFR calc Af Amer: >60 mL/min (ref >60)
GFR calc Af Amer: >60 mL/min (ref >60)
GFR calc non Af Amer: >60 mL/min (ref >60)
GFR calc non Af Amer: >60 mL/min (ref >60)
GLUCOSE: 92 mg/dL (ref 65–99)
Glucose, Bld: 84 mg/dL (ref 65–99)
Potassium: 3.3 mmol/L — ABNORMAL LOW (ref 3.5–5.1)
Potassium: 3 mmol/L — ABNORMAL LOW (ref 3.5–5.1)
SODIUM: 136 mmol/L (ref 135–145)
Sodium: 134 mmol/L — ABNORMAL LOW (ref 135–145)
Total Bilirubin: 0.6 mg/dL (ref 0.3–1.2)
Total Bilirubin: 0.5 mg/dL (ref 0.3–1.2)
Total Protein: 7.7 g/dL (ref 6.5–8.1)
Total Protein: 6.4 g/dL — ABNORMAL LOW (ref 6.5–8.1)

## 2016-08-18 LAB — CBC WITH DIFFERENTIAL/PLATELET
Band Neutrophils: 21 %
Basophils Absolute: 0 10*3/uL (ref 0.0–0.1)
Basophils Relative: 0 %
Blasts: 0 %
Eosinophils Absolute: 0 10*3/uL (ref 0.0–0.7)
Eosinophils Relative: 0 %
HCT: 32.6 % — ABNORMAL LOW (ref 36.0–46.0)
Hemoglobin: 10.7 g/dL — ABNORMAL LOW (ref 12.0–15.0)
Lymphocytes Relative: 1 %
Lymphs Abs: 0.3 10*3/uL — ABNORMAL LOW (ref 0.7–4.0)
MCH: 33.1 pg (ref 26.0–34.0)
MCHC: 32.8 g/dL (ref 30.0–36.0)
MCV: 100.9 fL — ABNORMAL HIGH (ref 78.0–100.0)
Metamyelocytes Relative: 9 %
Monocytes Absolute: 4.6 10*3/uL — ABNORMAL HIGH (ref 0.1–1.0)
Monocytes Relative: 15 %
Myelocytes: 4 %
Neutro Abs: 25.5 10*3/uL — ABNORMAL HIGH (ref 1.7–7.7)
Neutrophils Relative %: 50 %
Other: 0 %
Platelets: 120 10*3/uL — ABNORMAL LOW (ref 150–400)
Promyelocytes Absolute: 0 %
RBC: 3.23 MIL/uL — ABNORMAL LOW (ref 3.87–5.11)
RDW: 14.2 % (ref 11.5–15.5)
WBC Morphology: INCREASED
WBC: 30.4 10*3/uL — ABNORMAL HIGH (ref 4.0–10.5)
nRBC: 0 /100 WBC

## 2016-08-18 LAB — I-STAT ARTERIAL BLOOD GAS, ED
Acid-base deficit: 1 mmol/L (ref 0.0–2.0)
Bicarbonate: 25.1 mmol/L (ref 20.0–28.0)
O2 Saturation: 95 %
Patient temperature: 98.6
TCO2: 26 mmol/L (ref 0–100)
pCO2 arterial: 46.4 mmHg (ref 32.0–48.0)
pH, Arterial: 7.341 — ABNORMAL LOW (ref 7.350–7.450)
pO2, Arterial: 80 mmHg — ABNORMAL LOW (ref 83.0–108.0)

## 2016-08-18 LAB — CBC
HCT: 30 % — ABNORMAL LOW (ref 36.0–46.0)
HEMOGLOBIN: 10 g/dL — AB (ref 12.0–15.0)
MCH: 34.1 pg — ABNORMAL HIGH (ref 26.0–34.0)
MCHC: 33.3 g/dL (ref 30.0–36.0)
MCV: 102.4 fL — AB (ref 78.0–100.0)
PLATELETS: 97 10*3/uL — AB (ref 150–400)
RBC: 2.93 MIL/uL — AB (ref 3.87–5.11)
RDW: 14.5 % (ref 11.5–15.5)
WBC: 33.2 10*3/uL — AB (ref 4.0–10.5)

## 2016-08-18 LAB — I-STAT CG4 LACTIC ACID, ED
Lactic Acid, Venous: 2.05 mmol/L (ref 0.5–1.9)
Lactic Acid, Venous: 1.35 mmol/L (ref 0.5–1.9)

## 2016-08-18 LAB — PHOSPHORUS: PHOSPHORUS: 3.4 mg/dL (ref 2.5–4.6)

## 2016-08-18 LAB — CORTISOL: Cortisol, Plasma: 22.8 ug/dL

## 2016-08-18 LAB — MAGNESIUM: Magnesium: 1.8 mg/dL (ref 1.7–2.4)

## 2016-08-18 LAB — PROTIME-INR
INR: 1.45
Prothrombin Time: 17.8 seconds — ABNORMAL HIGH (ref 11.4–15.2)

## 2016-08-18 LAB — INFLUENZA PANEL BY PCR (TYPE A & B)
Influenza A By PCR: NEGATIVE
Influenza B By PCR: NEGATIVE

## 2016-08-18 LAB — PROCALCITONIN: Procalcitonin: 7.14 ng/mL

## 2016-08-18 LAB — APTT: APTT: 34 s (ref 24–36)

## 2016-08-18 MED ORDER — SODIUM CHLORIDE 0.9 % IV BOLUS (SEPSIS)
1000.0000 mL | Freq: Once | INTRAVENOUS | Status: AC
  Administered 2016-08-18: 1000 mL via INTRAVENOUS

## 2016-08-18 MED ORDER — LORAZEPAM 2 MG/ML IJ SOLN
0.5000 mg | Freq: Three times a day (TID) | INTRAMUSCULAR | Status: DC
  Filled 2016-08-18: qty 1

## 2016-08-18 MED ORDER — ACETAMINOPHEN 650 MG RE SUPP
650.0000 mg | Freq: Once | RECTAL | Status: AC
  Administered 2016-08-18: 650 mg via RECTAL
  Filled 2016-08-18: qty 1

## 2016-08-18 MED ORDER — SODIUM CHLORIDE 0.9 % IV SOLN
INTRAVENOUS | Status: DC
  Administered 2016-08-18: 14:00:00 via INTRAVENOUS

## 2016-08-18 MED ORDER — PIPERACILLIN-TAZOBACTAM 3.375 G IVPB 30 MIN
3.3750 g | Freq: Once | INTRAVENOUS | Status: AC
  Administered 2016-08-18: 3.375 g via INTRAVENOUS
  Filled 2016-08-18: qty 50

## 2016-08-18 MED ORDER — PANTOPRAZOLE SODIUM 40 MG IV SOLR
40.0000 mg | Freq: Every day | INTRAVENOUS | Status: DC
  Administered 2016-08-19 – 2016-08-20 (×2): 40 mg via INTRAVENOUS
  Filled 2016-08-18 (×3): qty 40

## 2016-08-18 MED ORDER — SODIUM CHLORIDE 0.9 % IV BOLUS (SEPSIS)
250.0000 mL | Freq: Once | INTRAVENOUS | Status: AC
  Administered 2016-08-18: 250 mL via INTRAVENOUS

## 2016-08-18 MED ORDER — PHENYLEPHRINE HCL 10 MG/ML IJ SOLN
0.0000 ug/min | INTRAMUSCULAR | Status: DC
  Administered 2016-08-18: 20 ug/min via INTRAVENOUS
  Administered 2016-08-18: 110 ug/min via INTRAVENOUS
  Administered 2016-08-18: 180 ug/min via INTRAVENOUS
  Administered 2016-08-18: 160 ug/min via INTRAVENOUS
  Administered 2016-08-19: 80 ug/min via INTRAVENOUS
  Administered 2016-08-19: 40 ug/min via INTRAVENOUS
  Administered 2016-08-19: 30 ug/min via INTRAVENOUS
  Administered 2016-08-19: 90 ug/min via INTRAVENOUS
  Filled 2016-08-18 (×9): qty 1

## 2016-08-18 MED ORDER — SODIUM CHLORIDE 0.9 % IV SOLN: 250.0000 mL | INTRAVENOUS | Status: DC | PRN

## 2016-08-18 MED ORDER — IPRATROPIUM-ALBUTEROL 0.5-2.5 (3) MG/3ML IN SOLN
3.0000 mL | Freq: Four times a day (QID) | RESPIRATORY_TRACT | Status: DC
  Administered 2016-08-18 – 2016-08-25 (×28): 3 mL via RESPIRATORY_TRACT
  Filled 2016-08-18 (×28): qty 3

## 2016-08-18 MED ORDER — HEPARIN SODIUM (PORCINE) 5000 UNIT/ML IJ SOLN
5000.0000 [IU] | Freq: Three times a day (TID) | INTRAMUSCULAR | Status: DC
  Filled 2016-08-18 (×6): qty 1

## 2016-08-18 MED ORDER — VANCOMYCIN HCL IN DEXTROSE 1-5 GM/200ML-% IV SOLN
1000.0000 mg | Freq: Once | INTRAVENOUS | Status: AC
  Administered 2016-08-18: 1000 mg via INTRAVENOUS
  Filled 2016-08-18: qty 200

## 2016-08-18 MED ORDER — PIPERACILLIN-TAZOBACTAM 3.375 G IVPB
3.3750 g | Freq: Three times a day (TID) | INTRAVENOUS | Status: DC
  Administered 2016-08-18 – 2016-08-22 (×11): 3.375 g via INTRAVENOUS
  Filled 2016-08-18 (×13): qty 50

## 2016-08-18 MED ORDER — VALPROATE SODIUM 500 MG/5ML IV SOLN
750.0000 mg | Freq: Two times a day (BID) | INTRAVENOUS | Status: DC
  Administered 2016-08-18 – 2016-08-24 (×13): 750 mg via INTRAVENOUS
  Filled 2016-08-18 (×13): qty 7.5

## 2016-08-18 MED ORDER — VANCOMYCIN HCL IN DEXTROSE 750-5 MG/150ML-% IV SOLN
750.0000 mg | INTRAVENOUS | Status: DC
Start: 2016-08-19 — End: 2016-08-21
  Administered 2016-08-19 – 2016-08-20 (×2): 750 mg via INTRAVENOUS
  Filled 2016-08-18 (×3): qty 150

## 2016-08-18 MED ORDER — SODIUM CHLORIDE 0.9 % IV SOLN
1000.0000 mg | Freq: Two times a day (BID) | INTRAVENOUS | Status: DC
  Administered 2016-08-18 – 2016-08-24 (×12): 1000 mg via INTRAVENOUS
  Filled 2016-08-18 (×13): qty 10

## 2016-08-18 NOTE — ED Notes (Signed)
Resp called for breathing tx

## 2016-08-18 NOTE — ED Notes (Signed)
IV team at bedside. Trying for a second IV access

## 2016-08-18 NOTE — ED Notes (Signed)
RT at bedside to draw a gas. Family at bedside; drinks given.

## 2016-08-18 NOTE — ED Notes (Addendum)
PT's sats continue to drop; placed on nonrebreather; PA notified.

## 2016-08-18 NOTE — H&P (Signed)
PULMONARY / CRITICAL CARE MEDICINE   Name: Michelle Oconnor MRN: [id number] DOB: 17-Dec-1980    ADMISSION DATE:  08/18/2016 CONSULTATION DATE:  08/18/2016  REFERRING MD:  Colin Broach [name]  CHIEF COMPLAINT:  SOB and respiratory failure  HISTORY OF PRESENT ILLNESS:   36 year old female with history of CP who presents with 2 day history of change in behavior and increase SOB.  Patient was brought to the ED where she was noted to be desaturating and with increased WOB.  Patient was placed on BiPAP and given IVF for hypotension.  Vitals stabilized but continued to require BiPAP.  PCCM was called to consult.  Little other history is available.  PAST MEDICAL HISTORY :  She  has a past medical history of Cerebral palsy ([place]) and Seizures ([place]).  PAST SURGICAL HISTORY: She  has a past surgical history that includes Dental surgery; Tooth Extraction (09/26/2011); and Examination under anesthesia (09/26/2011).  Allergies  Allergen Reactions  . Doxycycline Hyclate Swelling    REACTION: tongue swelling    No current facility-administered medications on file prior to encounter.    Current Outpatient Prescriptions on File Prior to Encounter  Medication Sig  . diazepam (DIASTAT ACUDIAL) 10 MG GEL Place 10 mg rectally every 6 (six) hours as needed. Post-seizure if Lorazepam does not subside anxiety, per pt's mother  . divalproex (DEPAKOTE SPRINKLE) 125 MG capsule Take 750 mg by mouth 2 (two) times daily.  Marland Kitchen glycopyrrolate (ROBINUL) 1 MG tablet Take 1 mg by mouth 3 (three) times daily.  Marland Kitchen lactulose (CHRONULAC) 10 GM/15ML solution Take 30 g by mouth daily.  Marland Kitchen LORazepam (ATIVAN) 1 MG tablet Take 1 mg by mouth every 8 (eight) hours as needed. For anxiety  . zonisamide (ZONEGRAN) 100 MG capsule Take 200 mg by mouth 2 (two) times daily.    FAMILY HISTORY:  Her has no family status information on file.    SOCIAL HISTORY: She  reports that she has never smoked. She does not have any smokeless tobacco history  on file. She reports that she does not drink alcohol or use drugs.  REVIEW OF SYSTEMS:   Unattainable  SUBJECTIVE:  Unattainable   VITAL SIGNS: BP 91/63   Pulse (!) 106   Temp (!) 100.6 F (38.1 C) (Rectal)   Resp (!) 22   Ht 4\' 11"  (1.499 m)   Wt 38.6 kg (85 lb)   SpO2 100%   BMI 17.17 kg/m   HEMODYNAMICS:    VENTILATOR SETTINGS:    INTAKE / OUTPUT: No intake/output data recorded.  PHYSICAL EXAMINATION: General:  Chronically ill appearing very small for stated age female that is in acute respiratory distress. Neuro:  Opens eyes, spontaneously moving all ext but nothing to command. HEENT:  [place]/AT, PERRL, EOM-I and MMM with very poor and misshapen dentition   Cardiovascular:  RRR, Nl S1/S2, -M/R/G. Lungs:  Coarse BS diffusely. Abdomen:  Soft, NT, ND and BS Musculoskeletal:  -edema and -tenderness Skin:  Intact  LABS:  BMET  Recent Labs Lab 08/18/16 1130  NA 134*  K 3.3*  CL 100*  CO2 26  BUN 20  CREATININE 0.65  GLUCOSE 84    Electrolytes  Recent Labs Lab 08/18/16 1130  CALCIUM 8.6*    CBC  Recent Labs Lab 08/18/16 1130  WBC 30.4*  HGB 10.7*  HCT 32.6*  PLT 120*    Coag's No results for input(s): APTT, INR in the last 168 hours.  Sepsis Markers  Recent Labs Lab  08/18/16 1149  LATICACIDVEN 2.05*    ABG  Recent Labs Lab 08/18/16 1223  PHART 7.341*  PCO2ART 46.4  PO2ART 80.0*    Liver Enzymes  Recent Labs Lab 08/18/16 1130  AST 18  ALT 8*  ALKPHOS 70  BILITOT 0.6  ALBUMIN 2.0*    Cardiac Enzymes No results for input(s): TROPONINI, PROBNP in the last 168 hours.  Glucose No results for input(s): GLUCAP in the last 168 hours.  Imaging Dg Chest Port 1 View  Result Date: 08/18/2016 CLINICAL DATA:  Fever and cough EXAM: PORTABLE CHEST 1 VIEW COMPARISON:  May 06, 2010 FINDINGS: There is patchy airspace opacity in the right lower lobe. Lungs elsewhere clear. Heart size and pulmonary vascularity are normal. No  adenopathy. No bone lesions. IMPRESSION: Patchy infiltrate right lower lobe consistent with pneumonia. Lungs elsewhere clear. No adenopathy. Electronically Signed   By: Lowella Grip III M.D.   On: 08/18/2016 11:06     STUDIES:  CXR with ARDS pattern  CULTURES: Blood 3/29>>> Urine 3/29>>> Sputum 3/29>>>  ANTIBIOTICS: Vancomycin 3/29>>> Zosyn 3/29>>> Tamiflu 3/29>>>  SIGNIFICANT EVENTS:  3/29>>> respiratory failure requiring BiPAP  LINES/TUBES: PIV BiPAP 3/29>>>  DISCUSSION: 36 year old female with CP who is severely debilitated.  Presenting with ARDS and I am concerned it could be a respiratory virus.  Requiring BiPAP and with her anatomy I am afraid she will be an extremely difficult airway.  ASSESSMENT / PLAN:  PULMONARY A: Acute respiratory failure on BiPAP P:   - Continue BiPAP for comfort - Titrate o2 for sat of 88-92%. - DNI status confirmed with mother.  CARDIOVASCULAR A:  Sinus tach due to respiratory failure P:  - Hydrate - Tele monitoring - No CPR/cardioversion/pressors/central access.  RENAL A:   No active issues P:   - Hydrate - Replace electrolytes as indicated - BMET in AM  GASTROINTESTINAL A:   No active issues P:   - NPO while on BiPAP - Concern for risk of chronic aspiration  HEMATOLOGIC A:   Leukocytosis P:  - CBC in AM - Transfuse per ICU protocol  INFECTIOUS A:   Concern for aspiration pneumonitis vs viral infection P:   - IV vanc - IV zosyn - PO tamiflu - Pan culture - RVP - Influenza swab  ENDOCRINE A:   No active issues   P:   - Monitor  NEUROLOGIC A:   CP by history, unable to assess if alter but exam is non-focal and neck is supple Seizure disorder by history on zonegram, depakoate and ativan P:   - Keppra 1 gm IV BID (unable to take PO and zonegram is not available PO). - Depakoate 750 mg IV BID - Ativan 1 mg PO will change to 0.5 mg IV q8 - Avoid further sedatives.  FAMILY  - Updates: Mother  and sisters are bed side.  Had an extensive conversation with them.  After discussion, they agreed that intubation/CPR/cardioversion/pressors are not appropriate for her.  Will treat medically but if deteriorates then will proceed with comfort.  Was a very difficult discussion but I believe this is what is best for the patient's wellbeing.    - Inter-disciplinary family meet or Palliative Care meeting due by:  day 7  The patient is critically ill with multiple organ systems failure and requires high complexity decision making for assessment and support, frequent evaluation and titration of therapies, application of advanced monitoring technologies and extensive interpretation of multiple databases.   Critical Care Time devoted to  patient care services described in this note is  45  Minutes. This time reflects time of care of this signee Dr Jennet Maduro. This critical care time does not reflect procedure time, or teaching time or supervisory time of PA/NP/Med student/Med Resident etc but could involve care discussion time.  Rush Farmer, M.D. [facility] Pulmonary/Critical Care Medicine. Pager: [phone]. After hours pager: [phone].  08/18/2016, 2:06 PM

## 2016-08-18 NOTE — Progress Notes (Signed)
eLink Physician-Brief Progress Note Patient Name: Michelle Oconnor DOB: 18-Aug-1980 MRN: [id number]   Date of Service  08/18/2016  HPI/Events of Note  Hypotension - BP = 71/52 (MAP=60).  eICU Interventions  Will order: 1. Bolus with 0.9 NaCl 1 liter IV over 1 hour now. 2. Phenylephrine IV infusion. Titrate to MAP >= 65.     Intervention Category Major Interventions: Hypotension - evaluation and management  Lysle Dingwall 08/18/2016, 4:46 PM

## 2016-08-18 NOTE — ED Notes (Signed)
Pt is opening eyes and more alert.

## 2016-08-18 NOTE — Progress Notes (Addendum)
Handoff communication received at 16 from Arnell Sieving, Therapist, sports,

## 2016-08-18 NOTE — ED Notes (Signed)
Attempted IV x 1 with no success.

## 2016-08-18 NOTE — ED Triage Notes (Signed)
Pt presents for evaluation of fever and congestion x 1 day. Mother reports pt has hx of cerebral palsy and sz. Pt mother reports she was able to ambulate with assistance yesterday which is her baseline, states she has been weak since last last night and less alert than normal.

## 2016-08-18 NOTE — ED Notes (Signed)
Multiple attempts made to gain IV access with patient. IV team to come

## 2016-08-18 NOTE — ED Notes (Addendum)
Notified critical MD and made him aware of pressures and that family did want the pt on pressors if her BP dropped and if that would help her. Dr. Corinna Lines acknowledged there wishes at this time.

## 2016-08-18 NOTE — ED Provider Notes (Signed)
[place] DEPT Provider Note   CSN: [id number] Arrival date & time: 08/18/16  1024     History   Chief Complaint Chief Complaint  Patient presents with  . Fever    HPI DISHA COTTAM is a 36 y.o. female.  HPI Patient presents to the emergency department with altered mental status, fever.  Patient has cerebral palsy and the mother is giving the history.  She states that at the beginning of the week.  She noticed the patient had nasal congestion and fever.  Mother states that the patient is normally able to communicate somewhat and walk with assistance but today was unable to arouse the patient.  Patient is unable to give me any history.  Past Medical History:  Diagnosis Date  . Cerebral palsy ([place])   . Seizures ([place])    has seizures monthly.  Dr Marlou Sa   [phone]    is her         Patient Active Problem List   Diagnosis Date Noted  . BENIGN NEOPLASM SKIN OTHER&UNSPEC PARTS FACE 02/26/2008  . CEREBRAL PALSY 02/01/2007  . 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 RHINITIS, ALLERGIC NOS 02/01/2007    Past Surgical History:  Procedure Laterality Date  . DENTAL SURGERY    . EXAMINATION UNDER ANESTHESIA  09/26/2011   Procedure: EXAM UNDER ANESTHESIA;  Surgeon: Ceasar Mons, DDS;  Location: [place];  Service: Oral Surgery;  Laterality: N/A;  By Dr. Alease Medina   . TOOTH EXTRACTION  09/26/2011   Procedure: EXTRACTION MOLARS;  Surgeon: Ceasar Mons, DDS;  Location: [place];  Service: Oral Surgery;  Laterality: N/A;  EXTRACTION OF TOOTH # 30 AND POSSIBLE OTHER TEETH    OB History    No data available       Home Medications    Prior to Admission medications   Medication Sig Start Date End Date Taking? Authorizing Provider  amoxicillin-clavulanate (AUGMENTIN) 600-42.9 MG/5ML suspension Take 600 mg by mouth 2 (two) times daily. Take for 7 days. First dose on 09/23/11    Historical Provider, MD  cetirizine (ZYRTEC) 10 MG tablet Take 10 mg by mouth daily.    Historical Provider,  MD  diazepam (DIASTAT ACUDIAL) 10 MG GEL Place 10 mg rectally every 6 (six) hours as needed. Post-seizure if Lorazepam does not subside anxiety, per pt's mother    Historical Provider, MD  divalproex (DEPAKOTE SPRINKLE) 125 MG capsule Take 750 mg by mouth 2 (two) times daily.    Historical Provider, MD  glycopyrrolate (ROBINUL) 1 MG tablet Take 1 mg by mouth 3 (three) times daily.    Historical Provider, MD  lactulose (CHRONULAC) 10 GM/15ML solution Take 30 g by mouth daily.    Historical Provider, MD  LORazepam (ATIVAN) 1 MG tablet Take 1 mg by mouth every 8 (eight) hours as needed. For anxiety    Historical Provider, MD  Montelukast Sodium (SINGULAIR PO) Take by mouth.    Historical Provider, MD  zonisamide (ZONEGRAN) 100 MG capsule Take 200 mg by mouth 2 (two) times daily.    Historical Provider, MD    Family History No family history on file.  Social History Social History  Substance Use Topics  . Smoking status: Never Smoker  . Smokeless tobacco: Not on file  . Alcohol use No     Allergies   Doxycycline hyclate   Review of Systems Review of Systems Level V caveat applies due to altered mental status and cerebral palsy  Physical Exam Updated  Vital Signs BP (!) 74/64   Pulse (!) 119   Temp (!) 101.9 F (38.8 C) (Rectal)   Resp (!) 21   Ht 4\' 11"  (1.499 m)   Wt 38.6 kg   SpO2 (!) 68%   BMI 17.17 kg/m   Physical Exam  Constitutional: She appears well-developed and well-nourished. She appears lethargic. No distress.  HENT:  Head: Normocephalic and atraumatic.  Mouth/Throat: Oropharynx is clear and moist.  Eyes: Pupils are equal, round, and reactive to light.  Neck: Normal range of motion. Neck supple.  Cardiovascular: Normal rate, regular rhythm and normal heart sounds.  Exam reveals no gallop and no friction rub.   No murmur heard. Pulmonary/Chest: Effort normal. No respiratory distress. She has wheezes in the right upper field, the right middle field, the right  lower field, the left upper field, the left middle field and the left lower field. She has rhonchi in the right upper field, the right middle field, the right lower field, the left upper field, the left middle field and the left lower field.  Abdominal: Soft. Bowel sounds are normal. She exhibits no distension. There is no tenderness.  Musculoskeletal: She exhibits no edema.  Neurological: She appears lethargic. GCS eye subscore is 2. GCS verbal subscore is 3. GCS motor subscore is 5.  Patient has contractures from cerebral palsy  Skin: Skin is warm and dry. Capillary refill takes less than 2 seconds. No rash noted. No erythema.  Psychiatric: She has a normal mood and affect. Her behavior is normal.  Nursing note and vitals reviewed.    ED Treatments / Results  Labs (all labs ordered are listed, but only abnormal results are displayed) Labs Reviewed  I-STAT CG4 LACTIC ACID, ED - Abnormal; Notable for the following:       Result Value   Lactic Acid, Venous 2.05 (*)    All other components within normal limits  CULTURE, BLOOD (ROUTINE X 2)  CULTURE, BLOOD (ROUTINE X 2)  COMPREHENSIVE METABOLIC PANEL  CBC WITH DIFFERENTIAL/PLATELET  URINALYSIS, ROUTINE W REFLEX MICROSCOPIC  INFLUENZA PANEL BY PCR (TYPE A & B)  I-STAT ARTERIAL BLOOD GAS, ED    EKG  EKG Interpretation None       Radiology Dg Chest Port 1 View  Result Date: 08/18/2016 CLINICAL DATA:  Fever and cough EXAM: PORTABLE CHEST 1 VIEW COMPARISON:  May 06, 2010 FINDINGS: There is patchy airspace opacity in the right lower lobe. Lungs elsewhere clear. Heart size and pulmonary vascularity are normal. No adenopathy. No bone lesions. IMPRESSION: Patchy infiltrate right lower lobe consistent with pneumonia. Lungs elsewhere clear. No adenopathy. Electronically Signed   By: Lowella Grip III M.D.   On: 08/18/2016 11:06    Procedures Procedures (including critical care time)  Medications Ordered in ED Medications    sodium chloride 0.9 % bolus 1,000 mL (1,000 mLs Intravenous New Bag/Given 08/18/16 1141)    And  sodium chloride 0.9 % bolus 250 mL (not administered)  piperacillin-tazobactam (ZOSYN) IVPB 3.375 g (3.375 g Intravenous New Bag/Given 08/18/16 1141)  vancomycin (VANCOCIN) IVPB 1000 mg/200 mL premix (1,000 mg Intravenous New Bag/Given 08/18/16 1141)  acetaminophen (TYLENOL) suppository 650 mg (650 mg Rectal Given 08/18/16 1035)     Initial Impression / Assessment and Plan / ED Course  I have reviewed the triage vital signs and the nursing notes.  Pertinent labs & imaging results that were available during my care of the patient were reviewed by me and considered in my medical decision making (  see chart for details).     CRITICAL CARE Performed by: Brent General Total critical care time: 50 minutes Critical care time was exclusive of separately billable procedures and treating other patients. Critical care was necessary to treat or prevent imminent or life-threatening deterioration. Critical care was time spent personally by me on the following activities: development of treatment plan with patient and/or surrogate as well as nursing, discussions with consultants, evaluation of patient's response to treatment, examination of patient, obtaining history from patient or surrogate, ordering and performing treatments and interventions, ordering and review of laboratory studies, ordering and review of radiographic studies, pulse oximetry and re-evaluation of patient's condition.  Patient has hypotension, tachycardia, fever and low oxygen saturations.  The patient was placed on BiPAP for respiratory support.  She will be a difficult intubation if that is necessary.  Consulted critical care, and they will evaluate the patient.  She will need ICU care.  The patient was closely monitored and consultation with the family was performed.  I initially advised the mother that the patient appears septic on my  initial examination and this is further confirmed by the laboratory testing and chest x-rays.  I advised the family that the patient has a serious infection and that she is critically ill.  We will continue to keep family informed of the patient's condition, although at this time she is critically ill Final Clinical Impressions(s) / ED Diagnoses   Final diagnoses:  None    New Prescriptions New Prescriptions   No medications on file     Dalia Heading, PA-C 08/18/16 1217

## 2016-08-18 NOTE — Progress Notes (Signed)
Pharmacy Antibiotic Note  Michelle Oconnor is a 36 y.o. female admitted on 08/18/2016 with sepsis.  Pharmacy has been consulted for vancomycin/zosyn dosing. Tmax 101.9. SCr 0.65 on admit (appears at baseline), CrCl~58.  Plan: Zosyn 3.375g IV (56min inf) x1; then 3.375g IV q8h (4h inf) Vancomycin 1g IV x1; then 750mg  IV q24h Monitor clinical progress, c/s, renal function, abx plan/LOT Vancomycin trough as indicated   Height: 4\' 11"  (149.9 cm) Weight: 85 lb (38.6 kg) IBW/kg (Calculated) : 43.2  Temp (24hrs), Avg:101.9 F (38.8 C), Min:101.9 F (38.8 C), Max:101.9 F (38.8 C)  No results for input(s): WBC, CREATININE, LATICACIDVEN, VANCOTROUGH, VANCOPEAK, VANCORANDOM, GENTTROUGH, GENTPEAK, GENTRANDOM, TOBRATROUGH, TOBRAPEAK, TOBRARND, AMIKACINPEAK, AMIKACINTROU, AMIKACIN in the last 168 hours.  CrCl cannot be calculated (Patient's most recent lab result is older than the maximum 21 days allowed.).    Allergies  Allergen Reactions  . Doxycycline Hyclate     REACTION: tongue swelling    Elicia Lamp, PharmD, BCPS Clinical Pharmacist 08/18/2016 11:04 AM

## 2016-08-18 NOTE — ED Notes (Signed)
Pt removed from bipap; mouth swabbed and lube placed around lips; bipap placed back on; no alarms

## 2016-08-18 NOTE — ED Notes (Signed)
Roselyn Reef RN attempted IV x 1 with no success.

## 2016-08-18 NOTE — ED Notes (Signed)
Rn introduced self to mom. Updated with POC

## 2016-08-19 ENCOUNTER — Encounter (HOSPITAL_COMMUNITY): Payer: Self-pay

## 2016-08-19 ENCOUNTER — Inpatient Hospital Stay (HOSPITAL_COMMUNITY): Payer: Medicare Other

## 2016-08-19 DIAGNOSIS — J9601 Acute respiratory failure with hypoxia: Secondary | ICD-10-CM

## 2016-08-19 LAB — BLOOD CULTURE ID PANEL (REFLEXED)

## 2016-08-19 LAB — GLUCOSE, CAPILLARY
GLUCOSE-CAPILLARY: 76 mg/dL (ref 65–99)
GLUCOSE-CAPILLARY: 78 mg/dL (ref 65–99)
GLUCOSE-CAPILLARY: 66 mg/dL (ref 65–99)
GLUCOSE-CAPILLARY: 117 mg/dL — AB (ref 65–99)
Glucose-Capillary: 112 mg/dL — ABNORMAL HIGH (ref 65–99)
Glucose-Capillary: 142 mg/dL — ABNORMAL HIGH (ref 65–99)
Glucose-Capillary: 73 mg/dL (ref 65–99)

## 2016-08-19 LAB — BASIC METABOLIC PANEL
Anion gap: 5 (ref 5–15)
Anion gap: 4 — ABNORMAL LOW (ref 5–15)
BUN: 9 mg/dL (ref 6–20)
BUN: 9 mg/dL (ref 6–20)
CALCIUM: 7.6 mg/dL — AB (ref 8.9–10.3)
CHLORIDE: 114 mmol/L — AB (ref 101–111)
CO2: 20 mmol/L — AB (ref 22–32)
CO2: 22 mmol/L (ref 22–32)
CREATININE: 0.42 mg/dL — AB (ref 0.44–1.00)
Calcium: 6.7 mg/dL — ABNORMAL LOW (ref 8.9–10.3)
Chloride: 113 mmol/L — ABNORMAL HIGH (ref 101–111)
Creatinine, Ser: 0.4 mg/dL — ABNORMAL LOW (ref 0.44–1.00)
GFR calc Af Amer: >60 mL/min (ref >60)
GFR calc non Af Amer: >60 mL/min (ref >60)
GFR calc non Af Amer: >60 mL/min (ref >60)
GLUCOSE: 102 mg/dL — AB (ref 65–99)
Glucose, Bld: 86 mg/dL (ref 65–99)
POTASSIUM: 2.1 mmol/L — AB (ref 3.5–5.1)
Potassium: 4.1 mmol/L (ref 3.5–5.1)
SODIUM: 139 mmol/L (ref 135–145)
Sodium: 139 mmol/L (ref 135–145)

## 2016-08-19 LAB — BLOOD GAS, ARTERIAL
Acid-base deficit: 4.6 mmol/L — ABNORMAL HIGH (ref 0.0–2.0)
BICARBONATE: 21.4 mmol/L (ref 20.0–28.0)
Delivery systems: POSITIVE
Drawn by: 345601
EXPIRATORY PAP: 8
FIO2: 60
INSPIRATORY PAP: 18
O2 SAT: 95.5 %
PATIENT TEMPERATURE: 98.6
PCO2 ART: 50 mmHg — AB (ref 32.0–48.0)
PEEP: 5 cmH2O
PO2 ART: 86 mmHg (ref 83.0–108.0)
RATE: 14 resp/min
pH, Arterial: 7.255 — ABNORMAL LOW (ref 7.350–7.450)

## 2016-08-19 LAB — MAGNESIUM: Magnesium: 1.4 mg/dL — ABNORMAL LOW (ref 1.7–2.4)

## 2016-08-19 LAB — CBC
HEMATOCRIT: 30.6 % — AB (ref 36.0–46.0)
HEMOGLOBIN: 9.7 g/dL — AB (ref 12.0–15.0)
MCH: 33 pg (ref 26.0–34.0)
MCHC: 31.7 g/dL (ref 30.0–36.0)
MCV: 104.1 fL — ABNORMAL HIGH (ref 78.0–100.0)
Platelets: 109 10*3/uL — ABNORMAL LOW (ref 150–400)
RBC: 2.94 MIL/uL — AB (ref 3.87–5.11)
RDW: 14.7 % (ref 11.5–15.5)
WBC: 56 10*3/uL (ref 4.0–10.5)

## 2016-08-19 LAB — HIV ANTIBODY (ROUTINE TESTING W REFLEX): HIV Screen 4th Generation wRfx: NONREACTIVE

## 2016-08-19 LAB — MRSA PCR SCREENING: MRSA BY PCR: NEGATIVE

## 2016-08-19 LAB — PHOSPHORUS: PHOSPHORUS: 2.6 mg/dL (ref 2.5–4.6)

## 2016-08-19 MED ORDER — DEXTROSE 50 % IV SOLN
25.0000 mL | Freq: Once | INTRAVENOUS | Status: AC
  Administered 2016-08-19: 25 mL via INTRAVENOUS

## 2016-08-19 MED ORDER — SODIUM CHLORIDE 0.9% FLUSH
10.0000 mL | Freq: Two times a day (BID) | INTRAVENOUS | Status: DC
  Administered 2016-08-19 – 2016-08-24 (×7): 10 mL

## 2016-08-19 MED ORDER — POTASSIUM CHLORIDE 2 MEQ/ML IV SOLN
Freq: Once | INTRAVENOUS | Status: AC
  Administered 2016-08-19: 05:00:00 via INTRAVENOUS
  Filled 2016-08-19: qty 1000

## 2016-08-19 MED ORDER — CHLORHEXIDINE GLUCONATE 0.12 % MT SOLN
15.0000 mL | Freq: Two times a day (BID) | OROMUCOSAL | Status: DC
  Administered 2016-08-19 – 2016-08-25 (×10): 15 mL via OROMUCOSAL
  Filled 2016-08-19 (×2): qty 15

## 2016-08-19 MED ORDER — ORAL CARE MOUTH RINSE: 15.0000 mL | Freq: Two times a day (BID) | OROMUCOSAL | Status: DC

## 2016-08-19 MED ORDER — ORAL CARE MOUTH RINSE
15.0000 mL | Freq: Two times a day (BID) | OROMUCOSAL | Status: DC
  Administered 2016-08-19 – 2016-08-25 (×13): 15 mL via OROMUCOSAL

## 2016-08-19 MED ORDER — DEXTROSE 50 % IV SOLN: 50.0000 mL | Freq: Once | INTRAVENOUS | Status: DC

## 2016-08-19 MED ORDER — CHLORHEXIDINE GLUCONATE CLOTH 2 % EX PADS
6.0000 | MEDICATED_PAD | Freq: Every day | CUTANEOUS | Status: DC
  Administered 2016-08-19 – 2016-08-24 (×5): 6 via TOPICAL

## 2016-08-19 MED ORDER — DEXTROSE 50 % IV SOLN
INTRAVENOUS | Status: AC
  Filled 2016-08-19: qty 50

## 2016-08-19 MED ORDER — MAGNESIUM SULFATE 2 GM/50ML IV SOLN
2.0000 g | Freq: Once | INTRAVENOUS | Status: AC
  Administered 2016-08-19: 2 g via INTRAVENOUS
  Filled 2016-08-19: qty 50

## 2016-08-19 MED ORDER — MAGNESIUM SULFATE 4 GM/100ML IV SOLN
4.0000 g | Freq: Once | INTRAVENOUS | Status: AC
  Administered 2016-08-19: 4 g via INTRAVENOUS
  Filled 2016-08-19: qty 100

## 2016-08-19 MED ORDER — SODIUM CHLORIDE 0.9% FLUSH: 10.0000 mL | INTRAVENOUS | Status: DC | PRN

## 2016-08-19 MED ORDER — DEXTROSE-NACL 5-0.45 % IV SOLN
INTRAVENOUS | Status: DC
  Administered 2016-08-19 – 2016-08-20 (×2): via INTRAVENOUS

## 2016-08-19 NOTE — Progress Notes (Signed)
CRITICAL VALUE ALERT  Critical value received:  WBC 56, K 2.1, Mg 1.4, Ca 6.7  Date of notification:  08/19/2016  Time of notification:  0420  Critical value read back:Yes.    Nurse who received alert:  Jannifer Franklin  MD notified (1st page):    Time of first page:    MD notified (2nd page):  Time of second page:  Responding MD:  Dr. Jimmy Footman  Time MD responded:  [phone]

## 2016-08-19 NOTE — Progress Notes (Signed)
Peripherally Inserted Central Catheter/Midline Placement  The IV Nurse has discussed with the patient and/or persons authorized to consent for the patient, the purpose of this procedure and the potential benefits and risks involved with this procedure.  The benefits include less needle sticks, lab draws from the catheter, and the patient may be discharged home with the catheter. Risks include, but not limited to, infection, bleeding, blood clot (thrombus formation), and puncture of an artery; nerve damage and irregular heartbeat and possibility to perform a PICC exchange if needed/ordered by physician.  Alternatives to this procedure were also discussed.  Bard Power PICC patient education guide, fact sheet on infection prevention and patient information card has been provided to patient /or left at bedside.    PICC/Midline Placement Documentation    Consent obtained by mother    Darlyn Read 08/19/2016, 2:28 PM

## 2016-08-19 NOTE — Progress Notes (Signed)
When pt arrived to 2M08 her mother became verbally aggressive with this RN and other staff members. She would not allow [place] to care for her daughter. When asked to step aside so we could get her settled she stated " You guys don't know how to care for her!!". She continued to yell at staff and complain. When she was informed about the ICU visiting hours she stated " I am not leaving her; you can call the police, I am not leaving!!  This RN and other staff continued to reassure her that we are well qualified to care for her daughter. Charge RN and AC at bedside to intervene. Pt's aunt came to the bedside. She and the pt's mother began to argue with the [employer] and Camera operator.   Several false complaints were made and ultimately they stated that they did not want me to care for the pt. Report was given to Sage, Therapist, sports.  Arnell Sieving, RN

## 2016-08-19 NOTE — Progress Notes (Signed)
RT changed pt to a small mask due to leaks.  Pt is tolerating the new mask well at this time.  Rt will continue to monitor.

## 2016-08-19 NOTE — Progress Notes (Signed)
eLink Physician-Brief Progress Note Patient Name: Michelle Oconnor DOB: Sep 04, 1980 MRN: [id number]   Date of Service  08/19/2016  HPI/Events of Note  Hypokalemia and hypomag  eICU Interventions  Potassium and mag replaced     Intervention Category Intermediate Interventions: Electrolyte abnormality - evaluation and management  [name] 08/19/2016, 4:15 AM

## 2016-08-19 NOTE — Progress Notes (Signed)
PULMONARY / CRITICAL CARE MEDICINE   Name: Michelle Oconnor MRN: [id number] DOB: [birth year]    ADMISSION DATE:  08/18/2016 CONSULTATION DATE:  08/18/2016  REFERRING MD:  Michelle Oconnor Michelle Oconnor  CHIEF COMPLAINT:  SOB and respiratory failure  brief 36 year old Michelle with history of CP who presents with 2 day history of change in behavior and increase SOB.  Patient was brought to the ED where she was noted to be desaturating and with increased WOB.  Patient was placed on BiPAP and given IVF for hypotension.  Vitals stabilized but continued to require BiPAP.  PCCM was called to consult.  Little other history is available.    STUDIES:  CXR with ARDS pattern  CULTURES: Blood 3/29>>> Urine 3/29>>> Sputum 3/29>>>  ANTIBIOTICS: Vancomycin 3/29>>> Zosyn 3/29>>> Tamiflu 3/29>>>  SIGNIFICANT EVENTS:  3/29>>> respiratory failure requiring BiPAP  LINES/TUBES: PIV BiPAP 3/29>>>   SUBJECTIVE/OVERNIGHT/INTERVAL HX 3/30 - remains on bipap. 50%, On low dose neo iv PIV. Mom and aunt and sister at bedsie. Michelle Oconnor used to take care of patient dad Mr Michelle Oconnor (now deceased). Flu negative. HIV negative   VITAL SIGNS: BP 107/69   Pulse 95   Temp 97.1 F (36.2 C) (Oral)   Resp 15   Ht 4\' 11"  (1.499 m)   Wt 37 kg (81 lb 9.1 oz)   SpO2 100%   BMI 16.48 kg/m   HEMODYNAMICS:    VENTILATOR SETTINGS: FiO2 (%):  [50 %-100 %] 50 %  INTAKE / OUTPUT: I/O last 3 completed shifts: In: 4339.5 [I.V.:3912; IV Piggyback:427.5] Out: -   PHYSICAL EXAMINATION:  General Appearance:    Looks criticall ill. FRAIL  Head:  Facies of cerebral palsy  Eyes:    PERRL - yes, conjunctiva/corneas - clear      Ears:    Normal external ear canals, both ears  Nose:   NG tube - no  Throat:  ETT TUBE - no but on bipap , OG tube - no  Neck:   Supple,  No enlargement/tenderness/nodules     Lungs:     Clear to auscultation bilaterally, on bipap  Chest wall:    No deformity  Heart:    S1 and S2 normal, no murmur,  CVP - no.  Pressors - on neo 83mcg  Abdomen:     Soft, no masses, no organomegaly  Genitalia:    Not done  Rectal:   not done  Extremities:   Extremities- contractures     Skin:   Intact in exposed areas . PEr mom no sacral decub     Neurologic:   Sedation - none -> RASS - -2 equivalent        LABS: PULMONARY  Recent Labs Lab 08/18/16 1223 08/19/16 0300  PHART 7.341* 7.255*  PCO2ART 46.4 50.0*  PO2ART 80.0* 86.0  HCO3 25.1 21.4  TCO2 26  --   O2SAT 95.0 95.5    CBC  Recent Labs Lab 08/18/16 1130 08/18/16 1602 08/19/16 0303  HGB 10.7* 10.0* 9.7*  HCT 32.6* 30.0* 30.6*  WBC 30.4* 33.2* 56.0*  PLT 120* 97* 109*    COAGULATION  Recent Labs Lab 08/18/16 1602  INR 1.45    CARDIAC  No results for input(s): TROPONINI in the last 168 hours. No results for input(s): PROBNP in the last 168 hours.   CHEMISTRY  Recent Labs Lab 08/18/16 1130 08/18/16 1602 08/19/16 0303  NA 134* 136 139  K 3.3* 3.0* 2.1*  CL 100* 105 114*  CO2  26 24 20*  GLUCOSE 84 92 86  BUN 20 17 9   CREATININE 0.65 0.60 0.40*  CALCIUM 8.6* 7.9* 6.7*  MG  --  1.8 1.4*  PHOS  --  3.4 2.6   Estimated Creatinine Clearance: 56.2 mL/min (A) (by C-G formula based on SCr of 0.4 mg/dL (L)).   LIVER  Recent Labs Lab 08/18/16 1130 08/18/16 1602  AST 18 20  ALT 8* 6*  ALKPHOS 70 59  BILITOT 0.6 0.5  PROT 7.7 6.4*  ALBUMIN 2.0* 1.6*  INR  --  1.45     INFECTIOUS  Recent Labs Lab 08/18/16 1149 08/18/16 1602 08/18/16 1731  LATICACIDVEN 2.05*  --  1.35  PROCALCITON  --  7.14  --      ENDOCRINE CBG (last 3)   Recent Labs  08/19/16 0652 08/19/16 0805 08/19/16 0902  GLUCAP 78 66 117*         IMAGING x48h  - image(s) personally visualized  -   highlighted in bold Dg Chest Port 1 View  Result Date: 08/19/2016 CLINICAL DATA:  Respiratory difficulty EXAM: PORTABLE CHEST 1 VIEW COMPARISON:  08/18/2016 FINDINGS: Cardiac shadow is within normal limits. The lungs are  well aerated bilaterally with patchy bibasilar infiltrates slightly increased when compared with the prior exam. The upper abdomen is within normal limits. No bony abnormality is seen. IMPRESSION: Patchy bibasilar infiltrates. Electronically Signed   By: Inez Catalina M.D.   On: 08/19/2016 07:37   Dg Chest Port 1 View  Result Date: 08/18/2016 CLINICAL DATA:  Fever and cough EXAM: PORTABLE CHEST 1 VIEW COMPARISON:  May 06, 2010 FINDINGS: There is patchy airspace opacity in the right lower lobe. Lungs elsewhere clear. Heart size and pulmonary vascularity are normal. No adenopathy. No bone lesions. IMPRESSION: Patchy infiltrate right lower lobe consistent with pneumonia. Lungs elsewhere clear. No adenopathy. Electronically Signed   By: Lowella Grip III M.D.   On: 08/18/2016 11:06       DISCUSSION: 36 year old Michelle with CP who is severely debilitated.  Presenting with ARDS and I am concerned it could be a respiratory virus.  Requiring BiPAP and with her anatomy I am afraid she will be an extremely difficult airway.  ASSESSMENT / PLAN:  PULMONARY A: Acute respiratory failure on BiPAP   3/30 - seems to be holding ground with bipap 50%, CXR with ALI   P:   - Continue BiPAP for comfort - Titrate o2 for sat of 88-92%. - DNI status confirmed with mother again 08/19/16  CARDIOVASCULAR A:  Septi shock  3/30 - neding pressors but coming down P:  - Hydrate - Tele monitoring - No CPR/cardioversion/pressors/central access. - place picc line  RENAL A:   Severe low K and Mag - repleted  P:   - Hydrate - Replace electrolytes as indicated - BMET in AM but recheck 3pm  GASTROINTESTINAL A:   No active issues P:   - NPO while on BiPAP - Concern for risk of chronic aspiration  HEMATOLOGIC A:   Leukocytosis P:  - CBC in AM - Transfuse per ICU protocol  INFECTIOUS A:   Concern for aspiration pneumonitis vs viral infection. Flu negtaive P:   - IV vanc - IV zosyn - Pan  culture - RVP   ENDOCRINE A:   No active issues   P:   - Monitor  NEUROLOGIC A:   CP by history, unable to assess if alter but exam is non-focal and neck is supple Seizure disorder  by history on zonegram, depakoate and ativan  - 3/30 -baseline can be fed but currently a bit more drwosy than baseline P:   - Keppra 1 gm IV BID (unable to take PO and zonegram is not available PO). - Depakoate 750 mg IV BID - Ativan 1 mg PO will change to 0.5 mg IV q8 - Avoid further sedatives.  FAMILY  - Updates: Mother and sisters are bed side.  Had an extensive conversation with them.  After discussion, they agreed that intubation/CPR/cardioversion/pressors are not appropriate for her.  Will treat medically but if deteriorates then will proceed with comfort.  Was a very difficult discussion but I believe this is what is best for the patient's wellbeing.      Interdisciplinary Goals of Care Family Meeting 3  Date carried out:: 08/19/2016   Location of the meeting: Bedside  Member's involved: Physician, Bedside Registered Nurse and Family Member or next of kin  Durable Power of Attorney or acting medical decision maker: mom    Discussion: We discussed goals of care for Michelle Oconnor .  Septic shock and resp failure  Code status: Limited Code or DNR but full medical care   Disposition: Continue current acute care  Time spent for the meeting: 10 min  Michelle Oconnor 08/19/2016, 12:12 PM    - Inter-disciplinary family meet or Palliative Care meeting due by:  day 7    The patient is critically ill with multiple organ systems failure and requires high complexity decision making for assessment and support, frequent evaluation and titration of therapies, application of advanced monitoring technologies and extensive interpretation of multiple databases.   Critical Care Time devoted to patient care services described in this note is  35  Minutes. This time reflects time of care of this  signee Dr Brand Males. This critical care time does not reflect procedure time, or teaching time or supervisory time of PA/NP/Med student/Med Resident etc but could involve care discussion time    Dr. Brand Males, M.D., [facility].C.P Pulmonary and Critical Care Medicine Staff Physician [facility] Pager: [phone], If no answer or between  15:00h - 7:00h: call [phone]  08/19/2016 11:52 AM

## 2016-08-19 NOTE — Progress Notes (Signed)
PHARMACY - PHYSICIAN COMMUNICATION CRITICAL VALUE ALERT - BLOOD CULTURE IDENTIFICATION (BCID)  Results for orders placed or performed during the hospital encounter of 08/18/16  Blood Culture ID Panel (Reflexed) (Collected: 08/18/2016 11:30 AM)  Result Value Ref Range   Enterococcus species NOT DETECTED NOT DETECTED   Listeria monocytogenes NOT DETECTED NOT DETECTED   Staphylococcus species DETECTED (A) NOT DETECTED   Staphylococcus aureus NOT DETECTED NOT DETECTED   Methicillin resistance NOT DETECTED NOT DETECTED   Streptococcus species NOT DETECTED NOT DETECTED   Streptococcus agalactiae NOT DETECTED NOT DETECTED   Streptococcus pneumoniae NOT DETECTED NOT DETECTED   Streptococcus pyogenes NOT DETECTED NOT DETECTED   Acinetobacter baumannii NOT DETECTED NOT DETECTED   Enterobacteriaceae species NOT DETECTED NOT DETECTED   Enterobacter cloacae complex NOT DETECTED NOT DETECTED   Escherichia coli NOT DETECTED NOT DETECTED   Klebsiella oxytoca NOT DETECTED NOT DETECTED   Klebsiella pneumoniae NOT DETECTED NOT DETECTED   Proteus species NOT DETECTED NOT DETECTED   Serratia marcescens NOT DETECTED NOT DETECTED   Haemophilus influenzae NOT DETECTED NOT DETECTED   Neisseria meningitidis NOT DETECTED NOT DETECTED   Pseudomonas aeruginosa NOT DETECTED NOT DETECTED   Candida albicans NOT DETECTED NOT DETECTED   Candida glabrata NOT DETECTED NOT DETECTED   Candida krusei NOT DETECTED NOT DETECTED   Candida parapsilosis NOT DETECTED NOT DETECTED   Candida tropicalis NOT DETECTED NOT DETECTED    Name of physician (or Provider) Contacted: CCM  Changes to prescribed antibiotics required: None  Harvel Quale 08/19/2016  9:42 AM

## 2016-08-20 ENCOUNTER — Inpatient Hospital Stay (HOSPITAL_COMMUNITY): Payer: Medicare Other

## 2016-08-20 DIAGNOSIS — J9600 Acute respiratory failure, unspecified whether with hypoxia or hypercapnia: Secondary | ICD-10-CM

## 2016-08-20 LAB — GLUCOSE, CAPILLARY
GLUCOSE-CAPILLARY: 143 mg/dL — AB (ref 65–99)
GLUCOSE-CAPILLARY: 102 mg/dL — AB (ref 65–99)
GLUCOSE-CAPILLARY: 153 mg/dL — AB (ref 65–99)
Glucose-Capillary: 146 mg/dL — ABNORMAL HIGH (ref 65–99)
Glucose-Capillary: 125 mg/dL — ABNORMAL HIGH (ref 65–99)

## 2016-08-20 LAB — CBC WITH DIFFERENTIAL/PLATELET
BASOS PCT: 0 %
Basophils Absolute: 0 10*3/uL (ref 0.0–0.1)
EOS PCT: 0 %
Eosinophils Absolute: 0 10*3/uL (ref 0.0–0.7)
HEMATOCRIT: 24.9 % — AB (ref 36.0–46.0)
Hemoglobin: 8.1 g/dL — ABNORMAL LOW (ref 12.0–15.0)
LYMPHS PCT: 6 %
Lymphs Abs: 2.5 10*3/uL (ref 0.7–4.0)
MCH: 34 pg (ref 26.0–34.0)
MCHC: 32.5 g/dL (ref 30.0–36.0)
MCV: 104.6 fL — AB (ref 78.0–100.0)
MONO ABS: 2.1 10*3/uL — AB (ref 0.1–1.0)
MONOS PCT: 5 %
NEUTROS PCT: 89 %
Neutro Abs: 36.9 10*3/uL — ABNORMAL HIGH (ref 1.7–7.7)
Platelets: 66 10*3/uL — ABNORMAL LOW (ref 150–400)
RBC: 2.38 MIL/uL — AB (ref 3.87–5.11)
RDW: 15.3 % (ref 11.5–15.5)
WBC MORPHOLOGY: INCREASED
WBC: 41.5 10*3/uL — AB (ref 4.0–10.5)

## 2016-08-20 LAB — BASIC METABOLIC PANEL
Anion gap: 4 — ABNORMAL LOW (ref 5–15)
BUN: 10 mg/dL (ref 6–20)
CALCIUM: 7.6 mg/dL — AB (ref 8.9–10.3)
CHLORIDE: 106 mmol/L (ref 101–111)
CO2: 24 mmol/L (ref 22–32)
CREATININE: 0.37 mg/dL — AB (ref 0.44–1.00)
Glucose, Bld: 144 mg/dL — ABNORMAL HIGH (ref 65–99)
POTASSIUM: 3.2 mmol/L — AB (ref 3.5–5.1)
SODIUM: 134 mmol/L — AB (ref 135–145)

## 2016-08-20 LAB — PHOSPHORUS: PHOSPHORUS: 2.1 mg/dL — AB (ref 2.5–4.6)

## 2016-08-20 LAB — MAGNESIUM: Magnesium: 2.5 mg/dL — ABNORMAL HIGH (ref 1.7–2.4)

## 2016-08-20 MED ORDER — SODIUM CHLORIDE 0.9 % IV SOLN
30.0000 meq | Freq: Once | INTRAVENOUS | Status: AC
  Administered 2016-08-20: 30 meq via INTRAVENOUS
  Filled 2016-08-20: qty 15

## 2016-08-20 MED ORDER — POTASSIUM PHOSPHATES 15 MMOLE/5ML IV SOLN
30.0000 mmol | Freq: Once | INTRAVENOUS | Status: AC
  Administered 2016-08-20: 30 mmol via INTRAVENOUS
  Filled 2016-08-20: qty 10

## 2016-08-20 MED ORDER — DEXTROSE-NACL 5-0.9 % IV SOLN
INTRAVENOUS | Status: DC
  Administered 2016-08-20: 21:00:00 via INTRAVENOUS

## 2016-08-20 MED ORDER — POTASSIUM CHLORIDE 20 MEQ/15ML (10%) PO SOLN: 40.0000 meq | Freq: Once | ORAL | Status: DC

## 2016-08-20 MED ORDER — HEPARIN SODIUM (PORCINE) 5000 UNIT/ML IJ SOLN: 5000.0000 [IU] | Freq: Three times a day (TID) | INTRAMUSCULAR | Status: DC

## 2016-08-20 MED ORDER — LORAZEPAM 2 MG/ML IJ SOLN
0.5000 mg | Freq: Three times a day (TID) | INTRAMUSCULAR | Status: DC | PRN
Start: 2016-08-20 — End: 2016-08-26

## 2016-08-20 MED ORDER — FUROSEMIDE 10 MG/ML IJ SOLN: 20.0000 mg | Freq: Once | INTRAMUSCULAR | Status: DC

## 2016-08-20 NOTE — Progress Notes (Signed)
eLink Physician-Brief Progress Note Patient Name: Michelle Oconnor DOB: Jan 27, 1981 MRN: [id number]   Date of Service  08/20/2016  HPI/Events of Note  Multiple issues: 1. Patient has 2 different IV fluid orders (last Na+ = 134 and last blood glucose = 102 and 2. Nurse wants to know if Dr. Chase Caller wants her to give the Lasix he ordered. CXR - Small bilateral pleural effusions appear increased bilaterally, possibly due to differences in positioning. Patchy bibasilar lung opacities, worsened on the right, suspicious for aspiration or pneumonia.   eICU Interventions  Will order: 1. Change IV fluid to D5 0.9 NaCl to run IV at 75 mL/hour.  2. D/C Lasix dose.      Intervention Category Major Interventions: Other:  Jayden Rudge Cornelia Copa 08/20/2016, 9:00 PM

## 2016-08-20 NOTE — Progress Notes (Signed)
PULMONARY / CRITICAL CARE MEDICINE   Name: Michelle Oconnor MRN: [id number] DOB: 10-Apr-1981    ADMISSION DATE:  08/18/2016 CONSULTATION DATE:  08/18/2016  REFERRING MD:  Colin Broach [name]  CHIEF COMPLAINT:  SOB and respiratory failure  brief 36 year old female with history of CP who presents with 2 day history of change in behavior and increase SOB.  Patient was brought to the ED where she was noted to be desaturating and with increased WOB.  Patient was placed on BiPAP and given IVF for hypotension.  Vitals stabilized but continued to require BiPAP.  PCCM was called to consult.  Little other history is available.    STUDIES:  CXR with ARDS pattern  CULTURES: Blood 3/29>>>staph>> Urine 3/29>>>neg Sputum 3/29>>>unable  ANTIBIOTICS: Vancomycin 3/29>>> Zosyn 3/29>>> Tamiflu 3/29>>>  SIGNIFICANT EVENTS:  3/29>>> respiratory failure requiring BiPAP  LINES/TUBES: PIV BiPAP 3/29>>>neg   SUBJECTIVE/OVERNIGHT/INTERVAL HX 3/30 - remains on bipap. 50%, On low dose neo iv PIV. Mom and aunt and sister at bedsie. Ramaswamy used to take care of patient dad Mr Noralyn Karim (now deceased). Flu negative. HIV negative   VITAL SIGNS: BP 97/64   Pulse (!) 101   Temp 98.5 F (36.9 C) (Axillary)   Resp 14   Ht 4\' 11"  (1.499 m)   Wt 37 kg (81 lb 9.1 oz)   SpO2 98%   BMI 16.48 kg/m   HEMODYNAMICS:    VENTILATOR SETTINGS: FiO2 (%):  [40 %] 40 %  INTAKE / OUTPUT: I/O last 3 completed shifts: In: 7663 [I.V.:5990.5; IV Piggyback:1672.5] Out: -   PHYSICAL EXAMINATION:  General Appearance:    Looks criticall ill. FRAIL  Head:  Facies of cerebral palsy  Eyes:    PERRL - yes, conjunctiva/corneas - clear      Ears:    Normal external ear canals, both ears  Nose:   NG tube - no  Throat:  ETT TUBE - no but on bipap , OG tube - no  Neck:   Supple,  No enlargement/tenderness/nodules     Lungs:     Clear to auscultation bilaterally, on bipap  Chest wall:    No deformity  Heart:    S1 and  S2 normal, no murmur, CVP - no.  Pressors - on neo 68mcg  Abdomen:     Soft, no masses, no organomegaly  Genitalia:    Not done  Rectal:   not done  Extremities:   Extremities- contractures     Skin:   Intact in exposed areas . PEr mom no sacral decub     Neurologic:   Sedation - none -> RASS - -2 equivalent        LABS: PULMONARY  Recent Labs Lab 08/18/16 1223 08/19/16 0300  PHART 7.341* 7.255*  PCO2ART 46.4 50.0*  PO2ART 80.0* 86.0  HCO3 25.1 21.4  TCO2 26  --   O2SAT 95.0 95.5    CBC  Recent Labs Lab 08/18/16 1602 08/19/16 0303 08/20/16 0500  HGB 10.0* 9.7* 8.1*  HCT 30.0* 30.6* 24.9*  WBC 33.2* 56.0* 41.5*  PLT 97* 109* 66*    COAGULATION  Recent Labs Lab 08/18/16 1602  INR 1.45    CARDIAC  No results for input(s): TROPONINI in the last 168 hours. No results for input(s): PROBNP in the last 168 hours.   CHEMISTRY  Recent Labs Lab 08/18/16 1130 08/18/16 1602 08/19/16 0303 08/19/16 1642 08/20/16 0500  NA 134* 136 139 139 134*  K 3.3* 3.0* 2.1* 4.1  3.2*  CL 100* 105 114* 113* 106  CO2 26 24 20* 22 24  GLUCOSE 84 92 86 102* 144*  BUN 20 17 9 9 10   CREATININE 0.65 0.60 0.40* 0.42* 0.37*  CALCIUM 8.6* 7.9* 6.7* 7.6* 7.6*  MG  --  1.8 1.4*  --  2.5*  PHOS  --  3.4 2.6  --  2.1*   Estimated Creatinine Clearance: 56.2 mL/min (A) (by C-G formula based on SCr of 0.37 mg/dL (L)).   LIVER  Recent Labs Lab 08/18/16 1130 08/18/16 1602  AST 18 20  ALT 8* 6*  ALKPHOS 70 59  BILITOT 0.6 0.5  PROT 7.7 6.4*  ALBUMIN 2.0* 1.6*  INR  --  1.45     INFECTIOUS  Recent Labs Lab 08/18/16 1149 08/18/16 1602 08/18/16 1731  LATICACIDVEN 2.05*  --  1.35  PROCALCITON  --  7.14  --      ENDOCRINE CBG (last 3)   Recent Labs  08/20/16 0801 08/20/16 1213 08/20/16 1552  GLUCAP 146* 125* 102*         IMAGING x48h  - image(s) personally visualized  -   highlighted in bold Dg Chest Port 1 View  Result Date: 08/19/2016 CLINICAL  DATA:  Respiratory difficulty EXAM: PORTABLE CHEST 1 VIEW COMPARISON:  08/18/2016 FINDINGS: Cardiac shadow is within normal limits. The lungs are well aerated bilaterally with patchy bibasilar infiltrates slightly increased when compared with the prior exam. The upper abdomen is within normal limits. No bony abnormality is seen. IMPRESSION: Patchy bibasilar infiltrates. Electronically Signed   By: Inez Catalina M.D.   On: 08/19/2016 07:37       DISCUSSION: 36 year old female with CP who is severely debilitated.  Presenting with ARDS and I am concerned it could be a respiratory virus.  Requiring BiPAP and with her anatomy I am afraid she will be an extremely difficult airway.  ASSESSMENT / PLAN:  PULMONARY A: Acute respiratory failure on BiPAP    P:   - Continue BiPAP for comfort - Titrate o2 for sat of 88-92%. - DNI status confirmed with mother again 08/20/16  CARDIOVASCULAR A:  Septi shock  3/31 off pressors P:  - Hydrate - Tele monitoring - No CPR/cardioversion/pressors/central access.   RENAL A:   low K   P:   - Hydrate - Replace electrolytes as indicated - BMET in AM   GASTROINTESTINAL A:   No active issues P:   - NPO while on BiPAP - Concern for risk of chronic aspiration  HEMATOLOGIC A:   Leukocytosis P:  - CBC as needed  - Transfuse per ICU protocol  INFECTIOUS A:   Concern for aspiration pneumonitis vs viral infection. Flu negtaive P:   - IV vanc - IV zosyn - Pan culture - RVP   ENDOCRINE A:   No active issues   P:   - Monitor  NEUROLOGIC A:   CP by history, unable to assess if alter but exam is non-focal and neck is supple Seizure disorder by history on zonegram, depakoate and ativan  - 3/30 -baseline can be fed but currently a bit more drwosy than baseline P:   - Keppra 1 gm IV BID (unable to take PO and zonegram is not available PO). - Depakoate 750 mg IV BID - Ativan 1 mg PO will change to 0.5 mg IV q8 - Avoid further  sedatives.  FAMILY  - Updates: Mother and sisters are bed side.  Had an extensive conversation with  them  After discussion, they agreed that intubation/CPR/cardioversion/pressors are not appropriate for her.  Will treat medically but if deteriorates then will proceed with comfort.  Was a very difficult discussion but I believe this is what is best for the patient's wellbeing.   3/31 extended time with family.   Interdisciplinary Goals of Care Family Meeting 3  Date carried out:: 08/20/2016   Location of the meeting: Bedside  Member's involved: Physician, Bedside Registered Nurse and Family Member or next of kin  Durable Power of Attorney or acting medical decision maker: mom    Discussion: We discussed goals of care for [employer] .  Septic shock and resp failure  Code status: Limited Code or DNR but full medical care   Disposition: Continue current acute care  Time spent for the meeting: 20 min  Richardson Landry Trey Bebee ACNP Maryanna Shape PCCM Pager [phone] till 3 pm If no answer page [phone] 08/20/2016, 5:21 PM

## 2016-08-20 NOTE — Progress Notes (Signed)
eLink Physician-Brief Progress Note Patient Name: Michelle Oconnor DOB: 1981/02/09 MRN: [id number]   Date of Service  08/20/2016  HPI/Events of Note  Hypokalemia and hypophos  eICU Interventions  Potassium and phos replaced     Intervention Category Intermediate Interventions: Electrolyte abnormality - evaluation and management  Diamonds Lippard 08/20/2016, 5:54 AM

## 2016-08-20 NOTE — Progress Notes (Signed)
RT attempted to take pt off Bipap, and placed on venturi mask 8L, 40%.  Pt tolerating well at this time. RN at bedside, MD notified.

## 2016-08-21 ENCOUNTER — Inpatient Hospital Stay (HOSPITAL_COMMUNITY): Payer: Medicare Other

## 2016-08-21 LAB — BASIC METABOLIC PANEL
Anion gap: 3 — ABNORMAL LOW (ref 5–15)
BUN: 10 mg/dL (ref 6–20)
CHLORIDE: 103 mmol/L (ref 101–111)
CO2: 24 mmol/L (ref 22–32)
CREATININE: 0.42 mg/dL — AB (ref 0.44–1.00)
Calcium: 7.6 mg/dL — ABNORMAL LOW (ref 8.9–10.3)
Glucose, Bld: 101 mg/dL — ABNORMAL HIGH (ref 65–99)
POTASSIUM: 4 mmol/L (ref 3.5–5.1)
SODIUM: 130 mmol/L — AB (ref 135–145)

## 2016-08-21 LAB — CBC WITH DIFFERENTIAL/PLATELET
BASOS ABS: 0 10*3/uL (ref 0.0–0.1)
Basophils Relative: 0 %
Eosinophils Absolute: 0 10*3/uL (ref 0.0–0.7)
Eosinophils Relative: 0 %
HEMATOCRIT: 25.3 % — AB (ref 36.0–46.0)
Hemoglobin: 8.1 g/dL — ABNORMAL LOW (ref 12.0–15.0)
LYMPHS PCT: 12 %
Lymphs Abs: 2.9 10*3/uL (ref 0.7–4.0)
MCH: 33.5 pg (ref 26.0–34.0)
MCHC: 32 g/dL (ref 30.0–36.0)
MCV: 104.5 fL — AB (ref 78.0–100.0)
Monocytes Absolute: 0.8 10*3/uL (ref 0.1–1.0)
Monocytes Relative: 3 %
NEUTROS ABS: 20.3 10*3/uL — AB (ref 1.7–7.7)
NEUTROS PCT: 85 %
Platelets: 47 10*3/uL — ABNORMAL LOW (ref 150–400)
RBC: 2.42 MIL/uL — AB (ref 3.87–5.11)
RDW: 15.1 % (ref 11.5–15.5)
WBC: 24 10*3/uL — ABNORMAL HIGH (ref 4.0–10.5)

## 2016-08-21 LAB — HEPATIC FUNCTION PANEL
ALBUMIN: 1.2 g/dL — AB (ref 3.5–5.0)
ALK PHOS: 81 U/L (ref 38–126)
ALT: 8 U/L — AB (ref 14–54)
AST: 16 U/L (ref 15–41)
Bilirubin, Direct: 0.1 mg/dL (ref 0.1–0.5)
Indirect Bilirubin: 0.3 mg/dL (ref 0.3–0.9)
TOTAL PROTEIN: 5.6 g/dL — AB (ref 6.5–8.1)
Total Bilirubin: 0.4 mg/dL (ref 0.3–1.2)

## 2016-08-21 LAB — CULTURE, BLOOD (ROUTINE X 2)

## 2016-08-21 LAB — URINALYSIS, ROUTINE W REFLEX MICROSCOPIC
Bilirubin Urine: NEGATIVE
Glucose, UA: NEGATIVE mg/dL
Hgb urine dipstick: NEGATIVE
Ketones, ur: NEGATIVE mg/dL
Leukocytes, UA: NEGATIVE
Nitrite: NEGATIVE
Protein, ur: NEGATIVE mg/dL
Specific Gravity, Urine: 1.02 (ref 1.005–1.030)
pH: 5.5 (ref 5.0–8.0)

## 2016-08-21 LAB — GLUCOSE, CAPILLARY
GLUCOSE-CAPILLARY: 115 mg/dL — AB (ref 65–99)
GLUCOSE-CAPILLARY: 80 mg/dL (ref 65–99)
GLUCOSE-CAPILLARY: 81 mg/dL (ref 65–99)
GLUCOSE-CAPILLARY: 90 mg/dL (ref 65–99)
Glucose-Capillary: 99 mg/dL (ref 65–99)
Glucose-Capillary: 91 mg/dL (ref 65–99)

## 2016-08-21 LAB — PHOSPHORUS: PHOSPHORUS: 3.5 mg/dL (ref 2.5–4.6)

## 2016-08-21 LAB — AMMONIA: Ammonia: 25 umol/L (ref 9–35)

## 2016-08-21 LAB — MAGNESIUM: Magnesium: 2 mg/dL (ref 1.7–2.4)

## 2016-08-21 LAB — STREP PNEUMONIAE URINARY ANTIGEN: STREP PNEUMO URINARY ANTIGEN: NEGATIVE

## 2016-08-21 LAB — VALPROIC ACID LEVEL: Valproic Acid Lvl: 74 ug/mL (ref 50.0–100.0)

## 2016-08-21 MED ORDER — FUROSEMIDE 10 MG/ML IJ SOLN
20.0000 mg | Freq: Once | INTRAMUSCULAR | Status: AC
  Administered 2016-08-21: 20 mg via INTRAVENOUS
  Filled 2016-08-21: qty 2

## 2016-08-21 MED ORDER — WHITE PETROLATUM GEL
Status: AC
  Administered 2016-08-21: 13:00:00
  Filled 2016-08-21: qty 1

## 2016-08-21 NOTE — Progress Notes (Signed)
Pt placed back on BIPAP due to increased WOB.  Tolerating well at this time.

## 2016-08-21 NOTE — Progress Notes (Signed)
PULMONARY / CRITICAL CARE MEDICINE   Name: RENAYE Oconnor MRN: [id number] DOB: February 12, 1981    ADMISSION DATE:  08/18/2016 CONSULTATION DATE:  08/18/2016  REFERRING MD:  Colin Broach [name]  CHIEF COMPLAINT:  SOB and respiratory failure  brief 36 year old female with history of CP who presents with 2 day history of change in behavior and increase SOB.  Patient was brought to the ED where she was noted to be desaturating and with increased WOB.  Patient was placed on BiPAP and given IVF for hypotension.  Vitals stabilized but continued to require BiPAP.  PCCM was called to consult.  Little other history is available.    STUDIES:  CXR with ARDS pattern  CULTURES: Blood 3/29>>>staph>> Urine 3/29>>>neg Sputum 3/29>>>unable  ANTIBIOTICS: Vancomycin 3/29>>> Zosyn 3/29>>> Tamiflu 3/29>>> LINES/TUBES: PIV BiPAP 3/29>>>neg  SIGNIFICANT EVENTS:  3/29>>> respiratory failure requiring BiPAP   3/30 - remains on bipap. 50%, On low dose neo iv PIV. Mom and aunt and sister at bedsie. Michelle Oconnor used to take care of patient dad Mr Michelle Oconnor (now deceased). Flu negative. HIV negative  3/31 - off pressors. Gave lasix challenge  SUBJECTIVE/OVERNIGHT/INTERVAL HX 08/21/16 - continued drop in platelets despite no h eparin. Pharmacy considering valproic as cause; home med. Mom requesting neurp consult. More awake per mom. Still on bipap / No seizues. Lasix not given yesterday   VITAL SIGNS: BP (!) 102/58   Pulse 94   Temp 97.4 F (36.3 C) (Axillary)   Resp 16   Ht 4\' 11"  (1.499 m)   Wt 37 kg (81 lb 9.1 oz)   SpO2 96%   BMI 16.48 kg/m   HEMODYNAMICS:    VENTILATOR SETTINGS: FiO2 (%):  [40 %] 40 %  INTAKE / OUTPUT: I/O last 3 completed shifts: In: 6834 [I.V.:2905; IV Piggyback:1460] Out: -   PHYSICAL EXAMINATION:  General Appearance:    frail  Head:    Facies of cerrebral palsy  Eyes:    PERRL - yes, conjunctiva/corneas - clear      Ears:    Normal external ear canals, both ears   Nose:   NG tube - no3  Throat:  ETT TUBE - no , OG tube - no  Neck: Flexion permanent     Lungs:     Clear to auscultation bilaterally, but mildly tachypneic  Chest wall:    No deformity  Heart:    S1 and S2 normal, no murmur, CVP - no.  Pressors - no  Abdomen:     Soft, no masses, no organomegaly  Genitalia:    Not done  Rectal:   not done  Extremities:   Extremities- contractures     Skin:   Intact in exposed areas .      Neurologic:   Sedation - none -> RASS - +1 . Moves all 4s - yes. CAM-ICU - na . Orientation - not orineted due to CP         LABS: PULMONARY  Recent Labs Lab 08/18/16 1223 08/19/16 0300  PHART 7.341* 7.255*  PCO2ART 46.4 50.0*  PO2ART 80.0* 86.0  HCO3 25.1 21.4  TCO2 26  --   O2SAT 95.0 95.5    CBC  Recent Labs Lab 08/19/16 0303 08/20/16 0500 08/21/16 0440  HGB 9.7* 8.1* 8.1*  HCT 30.6* 24.9* 25.3*  WBC 56.0* 41.5* 24.0*  PLT 109* 66* 47*    COAGULATION  Recent Labs Lab 08/18/16 1602  INR 1.45    CARDIAC  No results for  input(s): TROPONINI in the last 168 hours. No results for input(s): PROBNP in the last 168 hours.   CHEMISTRY  Recent Labs Lab 08/18/16 1602 08/19/16 0303 08/19/16 1642 08/20/16 0500 08/21/16 0440  NA 136 139 139 134* 130*  K 3.0* 2.1* 4.1 3.2* 4.0  CL 105 114* 113* 106 103  CO2 24 20* 22 24 24   GLUCOSE 92 86 102* 144* 101*  BUN 17 9 9 10 10   CREATININE 0.60 0.40* 0.42* 0.37* 0.42*  CALCIUM 7.9* 6.7* 7.6* 7.6* 7.6*  MG 1.8 1.4*  --  2.5* 2.0  PHOS 3.4 2.6  --  2.1* 3.5   Estimated Creatinine Clearance: 56.2 mL/min (A) (by C-G formula based on SCr of 0.42 mg/dL (L)).   LIVER  Recent Labs Lab 08/18/16 1130 08/18/16 1602  AST 18 20  ALT 8* 6*  ALKPHOS 70 59  BILITOT 0.6 0.5  PROT 7.7 6.4*  ALBUMIN 2.0* 1.6*  INR  --  1.45     INFECTIOUS  Recent Labs Lab 08/18/16 1149 08/18/16 1602 08/18/16 1731  LATICACIDVEN 2.05*  --  1.35  PROCALCITON  --  7.14  --      ENDOCRINE CBG  (last 3)   Recent Labs  08/21/16 0020 08/21/16 0345 08/21/16 0757  GLUCAP 115* 99 90         IMAGING x48h  - image(s) personally visualized  -   highlighted in bold Dg Chest Port 1 View  Result Date: 08/21/2016 CLINICAL DATA:  Acute respiratory failure EXAM: PORTABLE CHEST 1 VIEW COMPARISON:  08/20/2016 FINDINGS: Extensive bibasilar infiltrate unchanged. Bilateral pleural effusions unchanged Right arm PICC tip in the SVC unchanged. IMPRESSION: Bibasilar infiltrate and effusion unchanged. Electronically Signed   By: Franchot Gallo M.D.   On: 08/21/2016 07:09   Dg Chest Port 1 View  Result Date: 08/20/2016 CLINICAL DATA:  Respiratory failure EXAM: PORTABLE CHEST 1 VIEW COMPARISON:  Chest radiograph from one day prior. FINDINGS: Right PICC terminates in the middle third of the superior vena cava. Stable cardiomediastinal silhouette with normal heart size. No pneumothorax. Small bilateral pleural effusions appear increased bilaterally, although this may be due to differences in positioning. No pulmonary edema. Patchy bibasilar lung opacities, worsened on the right and stable on the left. IMPRESSION: 1. Small bilateral pleural effusions appear increased bilaterally, possibly due to differences in positioning. 2. Patchy bibasilar lung opacities, worsened on the right, suspicious for aspiration or pneumonia. Electronically Signed   By: Ilona Sorrel M.D.   On: 08/20/2016 20:41       DISCUSSION: 36 year old female with CP who is severely debilitated.  Presenting with ARDS and I am concerned it could be a respiratory virus.  Requiring BiPAP and with her anatomy I am afraid she will be an extremely difficult airway.  ASSESSMENT / PLAN:  PULMONARY A: Acute respiratory failure on BiPAP  4/1 - still tachypenic. CXR with effusion and bilateral LL infilrates    P:   - Continue BiPAP for comfort; try trial off 08/21/2016 - Lasix x 1  - get echo - Titrate o2 for sat of 88-92%. - DNI status  confirmed with mother again 08/20/16  CARDIOVASCULAR A:  Septi shock  3/31 off pressors 4/1/1 - still off pressors,. + 3.5L    P:  - saline lock  - empriic lasix - get echo - Tele monitoring - No CPR/cardioversion/pressors/central access.   RENAL A:   Normal lytes  P:   - Hydrate - Replace electrolytes as indicated -  BMET in AM  - Might need foleu  GASTROINTESTINAL A:   No active issues  4/1 - still NPO since admit 08/18/2016  P:   - NPO while on BiPAP - Concern for risk of chronic aspiration - TF by 08/22/16 if we canno get her off bipap - dc ppi  HEMATOLOGIC A:   Thrombocytoepnia - 08/21/16 and getting worse and acute without heparin  4/1- curb sided neuro - doubt due to depakote P:  - CBC as needed  - Transfuse per ICU protocol - dc vanc amd monito platelets  INFECTIOUS A:   Concern for aspiration pneumonitis vs viral infection VCAP . Flu negtaive. Family denies aspiration P:   - dc vanc (MRS neg) - IV zosyn - Pan culture   ENDOCRINE A:   No active issues   P:   - Monitor  NEUROLOGIC A:   CP by history, unable to assess if alter but exam is non-focal and neck is supple Seizure disorder by history on zonegram, depakoate and ativan  - 3/30 -baseline can be fed but currently a bit more drwosy than baseline - 08/21/16 - more awake , seizure free. Family worried she is not on zonegran yet  P:   - Keppra 1 gm IV BID (unable to take PO and zonegram is not available PO). And start zonegran when she can take po - Depakoate 750 mg IV BID (home med) - Ativan 1 mg PO will change to 0.5 mg IV q8 - Avoid further sedatives.  (plan curb sided with neruo)  FAMILY  - Updates at admit 08/18/2016  Mother and sisters are bed side.  Had an extensive conversation with them  After discussion, they agreed that intubation/CPR/cardioversion/pressors are not appropriate for her.  Will treat medically but if deteriorates then will proceed with comfort.  Was a very  difficult discussion but I believe this is what is best for the patient's wellbeing.   3/31 extended time with family.   - updated mom 4/1   DISPO Keep in ICU       The patient is critically ill with multiple organ systems failure and requires high complexity decision making for assessment and support, frequent evaluation and titration of therapies, application of advanced monitoring technologies and extensive interpretation of multiple databases.   Critical Care Time devoted to patient care services described in this note is  30  Minutes. This time reflects time of care of this signee Dr Brand Males. This critical care time does not reflect procedure time, or teaching time or supervisory time of PA/NP/Med student/Med Resident etc but could involve care discussion time    Dr. Brand Males, M.D., [facility].C.P Pulmonary and Critical Care Medicine Staff Physician [place] [facility] Pager: [phone], If no answer or between  15:00h - 7:00h: call [phone]  08/21/2016 10:02 AM

## 2016-08-21 NOTE — Progress Notes (Signed)
rsponded well to lasix per mom and was off bipap for > 1h  Plan Repeat lasix 20mg  IV  Dr. Brand Males, M.D., [facility].C.P Pulmonary and Critical Care Medicine Staff Physician [place] [facility] and Critical [facility] Pager: [phone], If no answer or between  15:00h - 7:00h: call [phone]  08/21/2016 2:58 PM

## 2016-08-21 NOTE — Progress Notes (Signed)
This note also relates to the following rows which could not be included: SpO2 - Cannot attach notes to unvalidated device data  Pt taken off Bipap and placed on a venturi mask at 8L, 40%. Pt tolerating well at this time.  RN notified.

## 2016-08-22 ENCOUNTER — Inpatient Hospital Stay (HOSPITAL_COMMUNITY): Payer: Medicare Other

## 2016-08-22 DIAGNOSIS — J189 Pneumonia, unspecified organism: Secondary | ICD-10-CM

## 2016-08-22 DIAGNOSIS — G9340 Encephalopathy, unspecified: Secondary | ICD-10-CM

## 2016-08-22 DIAGNOSIS — J80 Acute respiratory distress syndrome: Secondary | ICD-10-CM

## 2016-08-22 DIAGNOSIS — L8990 Pressure ulcer of unspecified site, unspecified stage: Secondary | ICD-10-CM

## 2016-08-22 LAB — CBC WITH DIFFERENTIAL/PLATELET
Basophils Absolute: 0 10*3/uL (ref 0.0–0.1)
Basophils Relative: 0 %
Eosinophils Absolute: 0 10*3/uL (ref 0.0–0.7)
Eosinophils Relative: 0 %
HEMATOCRIT: 24 % — AB (ref 36.0–46.0)
HEMOGLOBIN: 7.8 g/dL — AB (ref 12.0–15.0)
LYMPHS PCT: 17 %
Lymphs Abs: 2.1 10*3/uL (ref 0.7–4.0)
MCH: 33.3 pg (ref 26.0–34.0)
MCHC: 32.5 g/dL (ref 30.0–36.0)
MCV: 102.6 fL — AB (ref 78.0–100.0)
MONO ABS: 0.6 10*3/uL (ref 0.1–1.0)
MONOS PCT: 5 %
NEUTROS ABS: 9.4 10*3/uL — AB (ref 1.7–7.7)
NEUTROS PCT: 78 %
Platelets: 34 10*3/uL — ABNORMAL LOW (ref 150–400)
RBC: 2.34 MIL/uL — ABNORMAL LOW (ref 3.87–5.11)
RDW: 14.9 % (ref 11.5–15.5)
WBC: 12.1 10*3/uL — ABNORMAL HIGH (ref 4.0–10.5)

## 2016-08-22 LAB — LEGIONELLA PNEUMOPHILA SEROGP 1 UR AG: L. pneumophila Serogp 1 Ur Ag: NEGATIVE

## 2016-08-22 LAB — GLUCOSE, CAPILLARY
GLUCOSE-CAPILLARY: 107 mg/dL — AB (ref 65–99)
GLUCOSE-CAPILLARY: 67 mg/dL (ref 65–99)
GLUCOSE-CAPILLARY: 76 mg/dL (ref 65–99)
GLUCOSE-CAPILLARY: 93 mg/dL (ref 65–99)
GLUCOSE-CAPILLARY: 98 mg/dL (ref 65–99)
Glucose-Capillary: 93 mg/dL (ref 65–99)
Glucose-Capillary: 100 mg/dL — ABNORMAL HIGH (ref 65–99)
Glucose-Capillary: 101 mg/dL — ABNORMAL HIGH (ref 65–99)

## 2016-08-22 LAB — URINE CULTURE: Culture: NO GROWTH

## 2016-08-22 LAB — PHOSPHORUS: Phosphorus: 3.4 mg/dL (ref 2.5–4.6)

## 2016-08-22 LAB — ECHOCARDIOGRAM COMPLETE
Height: 59 in
Weight: 1463.85 oz

## 2016-08-22 LAB — MAGNESIUM: Magnesium: 1.7 mg/dL (ref 1.7–2.4)

## 2016-08-22 MED ORDER — DEXTROSE 10 % IV SOLN
INTRAVENOUS | Status: DC
  Administered 2016-08-22 – 2016-08-23 (×2): via INTRAVENOUS

## 2016-08-22 MED ORDER — LEVOFLOXACIN IN D5W 250 MG/50ML IV SOLN
250.0000 mg | INTRAVENOUS | Status: DC
  Administered 2016-08-22 – 2016-08-25 (×4): 250 mg via INTRAVENOUS
  Filled 2016-08-22 (×4): qty 50

## 2016-08-22 MED ORDER — FUROSEMIDE 10 MG/ML IJ SOLN
40.0000 mg | Freq: Once | INTRAMUSCULAR | Status: AC
  Administered 2016-08-22: 40 mg via INTRAVENOUS
  Filled 2016-08-22: qty 4

## 2016-08-22 MED ORDER — MAGNESIUM SULFATE 2 GM/50ML IV SOLN
2.0000 g | Freq: Once | INTRAVENOUS | Status: AC
  Administered 2016-08-22: 2 g via INTRAVENOUS
  Filled 2016-08-22: qty 50

## 2016-08-22 NOTE — Progress Notes (Signed)
Pt taken off Bipap to rest and placed on a VM 30%, 4L.  RN and MD notified.

## 2016-08-22 NOTE — Progress Notes (Signed)
PULMONARY  / CRITICAL CARE MEDICINE  Name: Michelle Oconnor MRN: [id number] DOB: Aug 27, 1980    LOS: 22  REFERRING MD :  EDP-Allen  CHIEF COMPLAINT:  Shortness of breath, respiratory failure  BRIEF PATIENT DESCRIPTION: 36 year old female with history of CP who presents with 2 day history of change in behavior and increase SOB.  Patient was brought to the ED where she was noted to be desaturating and with increased WOB.  Patient was placed on BiPAP and given IVF for hypotension.  Vitals stabilized but continued to require BiPAP.  PCCM was called to consult.  Little other history is available  LINES / TUBES: BiPAP 3/29>>>neg  CULTURES: Blood 3/29>>>staph>> Urine 3/29>>>neg Sputum 3/29>>>unable  ANTIBIOTICS: Vancomycin 3/29>>>d/c 4/1 Zosyn 3/29>>>  SIGNIFICANT EVENTS:  3/29>>> respiratory failure requiring BiPAP  3/30 - remains on bipap. 50%, On low dose neo iv PIV. Mom and aunt and sister at bedsie. Ramaswamy used to take care of patient dad Mr Michelle Oconnor (now deceased). Flu negative. HIV negative  3/31 - off pressors. Gave lasix challenge - tolerated off Bipap ~2hrs; ordered echo  LEVEL OF CARE:  ICU PRIMARY SERVICE:  PCCM CONSULTANTS:  n/a CODE STATUS DIET:  NPO DVT Px:  scd's GI Px:  none  HISTORY OF PRESENT ILLNESS:   36 year old female with history of CP who presents with 2 day history of change in behavior and increase SOB.  Patient was brought to the ED where she was noted to be desaturating and with increased WOB.  Patient was placed on BiPAP and given IVF for hypotension.  Vitals stabilized but continued to require BiPAP.  PCCM was called to consult.  Little other history is available  PAST MEDICAL HISTORY :  Past Medical History:  Diagnosis Date  . Cerebral palsy ([place])   . Seizures ([place])    has seizures monthly.  Dr Michelle Oconnor   [phone]    is her        Past Surgical History:  Procedure Laterality Date  . DENTAL SURGERY    . EXAMINATION UNDER ANESTHESIA   09/26/2011   Procedure: EXAM UNDER ANESTHESIA;  Surgeon: Ceasar Mons, DDS;  Location: [place];  Service: Oral Surgery;  Laterality: N/A;  By Dr. Alease Medina   . TOOTH EXTRACTION  09/26/2011   Procedure: EXTRACTION MOLARS;  Surgeon: Ceasar Mons, DDS;  Location: [place];  Service: Oral Surgery;  Laterality: N/A;  EXTRACTION OF TOOTH # 30 AND POSSIBLE OTHER TEETH   Prior to Admission medications   Medication Sig Start Date End Date Taking? Authorizing Provider  diazepam (DIASTAT ACUDIAL) 10 MG GEL Place 10 mg rectally every 6 (six) hours as needed. Post-seizure if Lorazepam does not subside anxiety, per pt's mother   Yes Historical Provider, MD  divalproex (DEPAKOTE SPRINKLE) 125 MG capsule Take 750 mg by mouth 2 (two) times daily.   Yes Historical Provider, MD  glycopyrrolate (ROBINUL) 1 MG tablet Take 1 mg by mouth 3 (three) times daily.   Yes Historical Provider, MD  ibuprofen (ADVIL,MOTRIN) 200 MG tablet Take 200 mg by mouth every 6 (six) hours as needed for fever or moderate pain.   Yes Historical Provider, MD  lactulose (CHRONULAC) 10 GM/15ML solution Take 30 g by mouth daily.   Yes Historical Provider, MD  LORazepam (ATIVAN) 1 MG tablet Take 1 mg by mouth every 8 (eight) hours as needed. For anxiety   Yes Historical Provider, MD  zonisamide (ZONEGRAN) 100 MG capsule Take 200  mg by mouth 2 (two) times daily.   Yes Historical Provider, MD   Allergies  Allergen Reactions  . Doxycycline Hyclate Swelling    REACTION: tongue swelling    FAMILY HISTORY:  No family history on file. SOCIAL HISTORY:  reports that she has never smoked. She has never used smokeless tobacco. She reports that she does not drink alcohol or use drugs.  REVIEW OF SYSTEMS:  Patient not able to communicate; per mother had some agitation this AM on waking but improved with music  INTERVAL HISTORY:  Continued drop in platelets today despite no heparin.  VITAL SIGNS: Temp:  [98.5 F (36.9 C)-99.3 F (37.4 C)] 99 F  (37.2 C) (04/02 0755) Pulse Rate:  [89-105] 92 (04/02 0829) Resp:  [14-28] 17 (04/02 0829) BP: (104-136)/(55-111) 129/71 (04/02 0829) SpO2:  [90 %-100 %] 100 % (04/02 0829) FiO2 (%):  [30 %-40 %] 30 % (04/02 0829) Weight:  [91 lb 7.9 oz (41.5 kg)] 91 lb 7.9 oz (41.5 kg) (04/02 0500) HEMODYNAMICS:   VENTILATOR SETTINGS: FiO2 (%):  [30 %-40 %] 30 % INTAKE / OUTPUT: Intake/Output      04/01 0701 - 04/02 0700 04/02 0701 - 04/03 0700   I.V. (mL/kg) 255 (6.1)    Other 1500    IV Piggyback 764.5    Total Intake(mL/kg) 2519.5 (60.7)    Urine (mL/kg/hr) 10 (0)    Total Output 10     Net +2509.5          Urine Occurrence 1 x      PHYSICAL EXAMINATION: General:  NAD Neuro:  Responds to voice, responds to some verbal commands HEENT:  Poor dentition Cardiovascular:  RRR, no murmurs, rubs or gallops Lungs:  Bilateral breath sounds present, somewhat coarse but on bipap Abdomen:  Soft, NDNT Musculoskeletal:  Decreased muscle mass throughout Skin:  intact   LABS: Cbc  Recent Labs Lab 08/20/16 0500 08/21/16 0440 08/22/16 0500  WBC 41.5* 24.0* 12.1*  HGB 8.1* 8.1* 7.8*  HCT 24.9* 25.3* 24.0*  PLT 66* 47* 34*    Chemistry   Recent Labs Lab 08/19/16 1642 08/20/16 0500 08/21/16 0440 08/22/16 0500  NA 139 134* 130*  --   K 4.1 3.2* 4.0  --   CL 113* 106 103  --   CO2 22 24 24   --   BUN 9 10 10   --   CREATININE 0.42* 0.37* 0.42*  --   CALCIUM 7.6* 7.6* 7.6*  --   MG  --  2.5* 2.0 1.7  PHOS  --  2.1* 3.5 3.4  GLUCOSE 102* 144* 101*  --     Liver fxn  Recent Labs Lab 08/18/16 1130 08/18/16 1602 08/21/16 0941  AST 18 20 16   ALT 8* 6* 8*  ALKPHOS 70 59 81  BILITOT 0.6 0.5 0.4  PROT 7.7 6.4* 5.6*  ALBUMIN 2.0* 1.6* 1.2*   coags  Recent Labs Lab 08/18/16 1602  APTT 34  INR 1.45   Sepsis markers  Recent Labs Lab 08/18/16 1149 08/18/16 1602 08/18/16 1731  LATICACIDVEN 2.05*  --  1.35  PROCALCITON  --  7.14  --    Cardiac markers No results for  input(s): CKTOTAL, CKMB, TROPONINI in the last 168 hours. BNP No results for input(s): PROBNP in the last 168 hours. ABG  Recent Labs Lab 08/18/16 1223 08/19/16 0300  PHART 7.341* 7.255*  PCO2ART 46.4 50.0*  PO2ART 80.0* 86.0  HCO3 25.1 21.4  TCO2 26  --  CBG trend  Recent Labs Lab 08/21/16 1959 08/22/16 0005 08/22/16 0124 08/22/16 0418 08/22/16 0752  GLUCAP 80 67 93 76 93    IMAGING:  ECG:  DIAGNOSES: Active Problems:   Respiratory failure (HCC)   ASSESSMENT / PLAN:  PULMONARY  ASSESSMENT: Acute respiratory failure on BiPAP Had some improvement and tolerance off bipap after Lasix yesterday  PLAN:   Continue BiPAP, trial off f/u echo DNI confirmed by Dr. Chase Caller 3/31  CARDIOVASCULAR  ASSESSMENT:  Septic shock on admission Has been off pressors since 3/31 UOP 250 overnight?  PLAN:  Consider repeating lasix f/u Echo  RENAL  ASSESSMENT:   Low UOP  PLAN:   f/u AM Bmet  GASTROINTESTINAL  ASSESSMENT:   No PO intake since admission.  PLAN:   If not tolerating off BiPAP today, may need Tube Feeds initiated  HEMATOLOGIC  ASSESSMENT:   Stable; Hgb 7.8; no signs of bleeding PLAN:  f/u AM CBC  INFECTIOUS  ASSESSMENT:   Zocyn; white count decreased to 12.1 this AM BCx coag neg staph 1/2 - likely contaminant  PLAN:   f/u AM CBC  ENDOCRINE  ASSESSMENT:   No PO intake; hypoglycemic last night  PLAN:   D10 21ml/hr Tube feeds today likely  NEUROLOGIC  ASSESSMENT:   No seizures  PLAN:   Keppra 1g IV BID Depakote Neuro consult?  CLINICAL SUMMARY:   I have personally obtained a history, examined the patient, evaluated laboratory and imaging results, formulated the assessment and plan and placed orders. CRITICAL CARE: The patient is critically ill with multiple organ systems failure and requires high complexity decision making for assessment and support, frequent evaluation and titration of therapies, application of  advanced monitoring technologies and extensive interpretation of multiple databases. Critical Care Time devoted to patient care services described in this note is 33 minutes.    Pulmonary and [place] Pager: [phone]  08/22/2016, 8:43 AM  Rush Farmer, M.D. [facility] Pulmonary/Critical Care Medicine. Pager: [phone]. After hours pager: [phone].

## 2016-08-22 NOTE — Consult Note (Addendum)
[place] Nurse wound consult note Reason for Consult: Consult requested for nose; pt was previously wearing a tight-fitting Bipap mask and is currently using an oxygen mask Wound type: Stage 2 medical device related pressure injury to the bridge of her nose Pressure Injury POA: No Measurement: 1.5X1.5X.1cm Wound bed: pink patchy area of partial thickness skin loss Drainage (amount, consistency, odor) small amt yellow drainage Dressing procedure/placement/frequency: Foam dressing was previously applied to protect the site but keeps sliding off.  Hydrocolloid dressing to protect and promote healing.  Discussed plan of care with family members at the bedside and they verbalized understanding. Please re-consult if further assistance is needed.  Thank-you,  Julien Girt MSN, [place], [place], [place], [place]

## 2016-08-22 NOTE — Progress Notes (Signed)
  Echocardiogram 2D Echocardiogram has been performed.  Michelle Oconnor L Androw 08/22/2016, 4:23 PM

## 2016-08-22 NOTE — Progress Notes (Signed)
PULMONARY / CRITICAL CARE MEDICINE   Name: Michelle Oconnor MRN: [id number] DOB: 19-Mar-1981    ADMISSION DATE:  08/18/2016 CONSULTATION DATE:  08/18/2016  REFERRING MD:  Colin Broach [name]  CHIEF COMPLAINT:  SOB and respiratory failure  brief 36 year old female with history of CP who presents with 2 day history of change in behavior and increase SOB.  Patient was brought to the ED where she was noted to be desaturating and with increased WOB.  Patient was placed on BiPAP and given IVF for hypotension.  Vitals stabilized but continued to require BiPAP.  PCCM was called to consult.  Little other history is available.    STUDIES:  CXR with ARDS pattern  CULTURES: Blood 3/29>>>staph>> Urine 3/29>>>neg Sputum 3/29>>>unable  ANTIBIOTICS: Vancomycin 3/29>>> Zosyn 3/29>>> Tamiflu 3/29>>> LINES/TUBES: PIV BiPAP 3/29>>>neg  SIGNIFICANT EVENTS:  3/29>>> respiratory failure requiring BiPAP   3/30 - remains on bipap. 50%, On low dose neo iv PIV. Mom and aunt and sister at bedsie. Ramaswamy used to take care of patient dad Mr Peachie Barkalow (now deceased). Flu negative. HIV negative  3/31 - off pressors. Gave lasix challenge  SUBJECTIVE/OVERNIGHT/INTERVAL HX 08/21/16 - continued drop in platelets despite no h eparin. Pharmacy considering valproic as cause; home med. Mom requesting neurp consult. More awake per mom. Still on bipap / No seizues. Lasix not given yesterday   VITAL SIGNS: BP 121/75   Pulse (!) 108   Temp 99 F (37.2 C) (Axillary)   Resp (!) 29   Ht 4\' 11"  (1.499 m)   Wt 41.5 kg (91 lb 7.9 oz)   SpO2 92%   BMI 18.48 kg/m   HEMODYNAMICS:    VENTILATOR SETTINGS: FiO2 (%):  [30 %-40 %] 30 %  INTAKE / OUTPUT: I/O last 3 completed shifts: In: 3722 [I.V.:1350; Other:1500; IV Piggyback:872] Out: 10 [Urine:10]  PHYSICAL EXAMINATION: General:  Chronically ill appearing very small for stated age female that is in acute respiratory distress. Neuro: Opens eyes but not  following commands but that is baseline HEENT:  [place]/AT, PERRL, EOM-I and MMM with very poor and anteriorly protruding dentition   Cardiovascular:  RRR, Nl S1/S2, -M/R/G. Lungs:  Coarse BS R>L. Abdomen:  Soft, NT, ND and BS Musculoskeletal:  -edema and -tenderness Skin:  Intact  LABS: PULMONARY  Recent Labs Lab 08/18/16 1223 08/19/16 0300  PHART 7.341* 7.255*  PCO2ART 46.4 50.0*  PO2ART 80.0* 86.0  HCO3 25.1 21.4  TCO2 26  --   O2SAT 95.0 95.5   CBC  Recent Labs Lab 08/20/16 0500 08/21/16 0440 08/22/16 0500  HGB 8.1* 8.1* 7.8*  HCT 24.9* 25.3* 24.0*  WBC 41.5* 24.0* 12.1*  PLT 66* 47* 34*    COAGULATION  Recent Labs Lab 08/18/16 1602  INR 1.45    CARDIAC  No results for input(s): TROPONINI in the last 168 hours. No results for input(s): PROBNP in the last 168 hours.   CHEMISTRY  Recent Labs Lab 08/18/16 1602 08/19/16 0303 08/19/16 1642 08/20/16 0500 08/21/16 0440 08/22/16 0500  NA 136 139 139 134* 130*  --   K 3.0* 2.1* 4.1 3.2* 4.0  --   CL 105 114* 113* 106 103  --   CO2 24 20* 22 24 24   --   GLUCOSE 92 86 102* 144* 101*  --   BUN 17 9 9 10 10   --   CREATININE 0.60 0.40* 0.42* 0.37* 0.42*  --   CALCIUM 7.9* 6.7* 7.6* 7.6* 7.6*  --  MG 1.8 1.4*  --  2.5* 2.0 1.7  PHOS 3.4 2.6  --  2.1* 3.5 3.4   Estimated Creatinine Clearance: 63.1 mL/min (A) (by C-G formula based on SCr of 0.42 mg/dL (L)).  LIVER  Recent Labs Lab 08/18/16 1130 08/18/16 1602 08/21/16 0941  AST 18 20 16   ALT 8* 6* 8*  ALKPHOS 70 59 81  BILITOT 0.6 0.5 0.4  PROT 7.7 6.4* 5.6*  ALBUMIN 2.0* 1.6* 1.2*  INR  --  1.45  --    INFECTIOUS  Recent Labs Lab 08/18/16 1149 08/18/16 1602 08/18/16 1731  LATICACIDVEN 2.05*  --  1.35  PROCALCITON  --  7.14  --    ENDOCRINE CBG (last 3)   Recent Labs  08/22/16 0124 08/22/16 0418 08/22/16 0752  GLUCAP 93 76 93   IMAGING x48h  - image(s) personally visualized  -   highlighted in bold Dg Chest Port 1 View  Result  Date: 08/22/2016 CLINICAL DATA:  Fever and recent weight gain EXAM: PORTABLE CHEST 1 VIEW COMPARISON:  August 21, 2016 FINDINGS: Central catheter tip is in the superior vena cava. No pneumothorax. There is persistent right pleural effusion. There is airspace consolidation in the lower lung regions bilaterally, somewhat more on the left than on the right. Heart is upper normal in size with pulmonary vascularity within normal limits. No adenopathy. No bone lesions. IMPRESSION: Space consolidation consistent with pneumonia both lower lobes, more on the left on the right. Stable moderate pleural effusion on the right. Stable cardiac silhouette. Central catheter tip in superior vena cava without pneumothorax. Electronically Signed   By: Lowella Grip III M.D.   On: 08/22/2016 09:39   Dg Chest Port 1 View  Result Date: 08/21/2016 CLINICAL DATA:  Acute respiratory failure EXAM: PORTABLE CHEST 1 VIEW COMPARISON:  08/20/2016 FINDINGS: Extensive bibasilar infiltrate unchanged. Bilateral pleural effusions unchanged Right arm PICC tip in the SVC unchanged. IMPRESSION: Bibasilar infiltrate and effusion unchanged. Electronically Signed   By: Franchot Gallo M.D.   On: 08/21/2016 07:09   Dg Chest Port 1 View  Result Date: 08/20/2016 CLINICAL DATA:  Respiratory failure EXAM: PORTABLE CHEST 1 VIEW COMPARISON:  Chest radiograph from one day prior. FINDINGS: Right PICC terminates in the middle third of the superior vena cava. Stable cardiomediastinal silhouette with normal heart size. No pneumothorax. Small bilateral pleural effusions appear increased bilaterally, although this may be due to differences in positioning. No pulmonary edema. Patchy bibasilar lung opacities, worsened on the right and stable on the left. IMPRESSION: 1. Small bilateral pleural effusions appear increased bilaterally, possibly due to differences in positioning. 2. Patchy bibasilar lung opacities, worsened on the right, suspicious for aspiration or  pneumonia. Electronically Signed   By: Ilona Sorrel M.D.   On: 08/20/2016 20:41   DISCUSSION: 36 year old female with CP who is severely debilitated.  Presenting with ARDS and I am concerned it could be a respiratory virus.  Requiring BiPAP and with her anatomy I am afraid she will be an extremely difficult airway.  ASSESSMENT / PLAN:  PULMONARY A: Acute respiratory failure on BiPAP  P:   - Continue BiPAP for comfort - Lasix as below - Get echo - Titrate o2 for sat of 88-92%. - DNI status confirmed with mother again 08/20/16  CARDIOVASCULAR A:  Septi shock  P:  - Saline lock - Empriic lasix - Get echo - Tele monitoring - No CPR/cardioversion/pressors/central access.  RENAL A:   Normal lytes  P:   -  Hydrate - Replace electrolytes as indicated - BMET in AM  - Lasix 40 mg IVx1.  GASTROINTESTINAL A:   No active issues  P:   - NPO while on BiPAP - Concern for risk of chronic aspiration - TF by 08/22/16 if we canno get her off bipap - D/C PPI - TPN if can not feed by AM  HEMATOLOGIC A:   Thrombocytoepnia - 08/21/16 and getting worse and acute without heparin  P:  - CBC as needed  - Transfuse per ICU protocol - D/C vanc and zosyn - Levaquin per pharmacy  INFECTIOUS A:   Concern for aspiration pneumonitis vs viral infection VCAP . Flu negtaive. Family denies aspiration P:   - Levaqin per pharmacy - D/C IV zosyn - Pan culture  ENDOCRINE A:   No active issues   P:   - Monitor  NEUROLOGIC A:   CP by history, unable to assess if alter but exam is non-focal and neck is supple Seizure disorder by history on zonegram, depakoate and ativan  P:   - Keppra 1 gm IV BID (unable to take PO and zonegram is not available PO). And start zonegran when she can take po - Depakoate 750 mg IV BID (home med) - Ativan 1 mg PO will change to 0.5 mg IV q8 - Avoid further sedatives.  FAMILY  - Family updated bedside.  The patient is critically ill with multiple organ  systems failure and requires high complexity decision making for assessment and support, frequent evaluation and titration of therapies, application of advanced monitoring technologies and extensive interpretation of multiple databases.   Critical Care Time devoted to patient care services described in this note is  30  Minutes. This time reflects time of care of this signee Dr Brand Males. This critical care time does not reflect procedure time, or teaching time or supervisory time of PA/NP/Med student/Med Resident etc but could involve care discussion time    Dr. Brand Males, M.D., [facility].C.P Pulmonary and Critical Care Medicine Staff Physician [place] [facility] Pager: [phone], If no answer or between  15:00h - 7:00h: call [phone]  08/22/2016 10:52 AM

## 2016-08-22 NOTE — Progress Notes (Signed)
RT placed pt back on Bipap per family request. Pt tolerating well at this time.

## 2016-08-22 NOTE — Progress Notes (Signed)
eLink Physician-Brief Progress Note Patient Name: Michelle Oconnor DOB: 1980/12/27 MRN: [id number]   Date of Service  08/22/2016  HPI/Events of Note  hypoglycemia  eICU Interventions  D10 30cc/hr     Intervention Category Minor Interventions: Routine modifications to care plan (e.g. PRN medications for pain, fever)  Simonne Maffucci 08/22/2016, 12:16 AM

## 2016-08-22 NOTE — Progress Notes (Signed)
RT spoke to wound care RN concerning skin breakdown on the bridge of the pt's nose from the Bipap mask.

## 2016-08-23 ENCOUNTER — Inpatient Hospital Stay (HOSPITAL_COMMUNITY): Payer: Medicare Other

## 2016-08-23 DIAGNOSIS — E8770 Fluid overload, unspecified: Secondary | ICD-10-CM

## 2016-08-23 LAB — CBC WITH DIFFERENTIAL/PLATELET
Basophils Absolute: 0 10*3/uL (ref 0.0–0.1)
Basophils Relative: 0 %
Eosinophils Absolute: 0 10*3/uL (ref 0.0–0.7)
Eosinophils Relative: 0 %
HEMATOCRIT: 23.8 % — AB (ref 36.0–46.0)
HEMOGLOBIN: 7.7 g/dL — AB (ref 12.0–15.0)
LYMPHS ABS: 2.2 10*3/uL (ref 0.7–4.0)
Lymphocytes Relative: 20 %
MCH: 33 pg (ref 26.0–34.0)
MCHC: 32.4 g/dL (ref 30.0–36.0)
MCV: 102.1 fL — AB (ref 78.0–100.0)
MONO ABS: 0.9 10*3/uL (ref 0.1–1.0)
MONOS PCT: 8 %
NEUTROS ABS: 8.1 10*3/uL — AB (ref 1.7–7.7)
Neutrophils Relative %: 73 %
Platelets: 27 10*3/uL — CL (ref 150–400)
RBC: 2.33 MIL/uL — ABNORMAL LOW (ref 3.87–5.11)
RDW: 14.4 % (ref 11.5–15.5)
WBC: 11.2 10*3/uL — ABNORMAL HIGH (ref 4.0–10.5)

## 2016-08-23 LAB — BASIC METABOLIC PANEL
ANION GAP: 4 — AB (ref 5–15)
BUN: 7 mg/dL (ref 6–20)
CHLORIDE: 95 mmol/L — AB (ref 101–111)
CO2: 31 mmol/L (ref 22–32)
Calcium: 7.4 mg/dL — ABNORMAL LOW (ref 8.9–10.3)
Creatinine, Ser: 0.35 mg/dL — ABNORMAL LOW (ref 0.44–1.00)
GFR calc Af Amer: >60 mL/min (ref >60)
GLUCOSE: 229 mg/dL — AB (ref 65–99)
POTASSIUM: 2.7 mmol/L — AB (ref 3.5–5.1)
Sodium: 130 mmol/L — ABNORMAL LOW (ref 135–145)

## 2016-08-23 LAB — GLUCOSE, CAPILLARY
GLUCOSE-CAPILLARY: 137 mg/dL — AB (ref 65–99)
GLUCOSE-CAPILLARY: 161 mg/dL — AB (ref 65–99)
GLUCOSE-CAPILLARY: 170 mg/dL — AB (ref 65–99)
GLUCOSE-CAPILLARY: 95 mg/dL (ref 65–99)
Glucose-Capillary: 202 mg/dL — ABNORMAL HIGH (ref 65–99)
Glucose-Capillary: 126 mg/dL — ABNORMAL HIGH (ref 65–99)

## 2016-08-23 LAB — PHOSPHORUS
Phosphorus: 2.2 mg/dL — ABNORMAL LOW (ref 2.5–4.6)
Phosphorus: 2.3 mg/dL — ABNORMAL LOW (ref 2.5–4.6)
Phosphorus: 6.3 mg/dL — ABNORMAL HIGH (ref 2.5–4.6)

## 2016-08-23 LAB — CULTURE, BLOOD (ROUTINE X 2): Culture: NO GROWTH

## 2016-08-23 LAB — MAGNESIUM
MAGNESIUM: 1.7 mg/dL (ref 1.7–2.4)
MAGNESIUM: 1.7 mg/dL (ref 1.7–2.4)
Magnesium: 1.9 mg/dL (ref 1.7–2.4)

## 2016-08-23 MED ORDER — POTASSIUM CHLORIDE 2 MEQ/ML IV SOLN
30.0000 meq | Freq: Once | INTRAVENOUS | Status: AC
  Administered 2016-08-23: 30 meq via INTRAVENOUS
  Filled 2016-08-23: qty 15

## 2016-08-23 MED ORDER — FUROSEMIDE 10 MG/ML IJ SOLN: 20.0000 mg | Freq: Once | INTRAMUSCULAR | Status: DC

## 2016-08-23 MED ORDER — JEVITY 1.2 CAL PO LIQD
1000.0000 mL | ORAL | Status: DC
  Administered 2016-08-23 – 2016-08-24 (×2): 1000 mL
  Filled 2016-08-23 (×6): qty 1000

## 2016-08-23 MED ORDER — ZONISAMIDE 100 MG PO CAPS
200.0000 mg | ORAL_CAPSULE | Freq: Two times a day (BID) | ORAL | Status: DC
  Administered 2016-08-23: 200 mg via ORAL
  Filled 2016-08-23 (×5): qty 2

## 2016-08-23 MED ORDER — FUROSEMIDE 10 MG/ML IJ SOLN
40.0000 mg | Freq: Once | INTRAMUSCULAR | Status: AC
  Administered 2016-08-23: 40 mg via INTRAVENOUS
  Filled 2016-08-23: qty 4

## 2016-08-23 MED ORDER — LACTULOSE 10 GM/15ML PO SOLN
30.0000 g | Freq: Every day | ORAL | Status: DC
  Administered 2016-08-23: 30 g via ORAL
  Filled 2016-08-23 (×3): qty 45

## 2016-08-23 MED ORDER — PRO-STAT SUGAR FREE PO LIQD
30.0000 mL | Freq: Two times a day (BID) | ORAL | Status: DC
  Filled 2016-08-23: qty 30

## 2016-08-23 MED ORDER — POTASSIUM PHOSPHATES 15 MMOLE/5ML IV SOLN
20.0000 meq | Freq: Once | INTRAVENOUS | Status: AC
  Administered 2016-08-23: 20 meq via INTRAVENOUS
  Filled 2016-08-23: qty 4.55

## 2016-08-23 MED ORDER — VITAL HIGH PROTEIN PO LIQD: 1000.0000 mL | ORAL | Status: DC

## 2016-08-23 NOTE — Progress Notes (Signed)
PULMONARY  / CRITICAL CARE MEDICINE  Name: Michelle Oconnor MRN: [id number] DOB: [birth year]    PLEASE REFER TO ATTENDING NOTE FOR THE DAY FOR UPDATE INFO AND PLAN.  LOS: 39  REFERRING MD :  EDP-[facility]  CHIEF COMPLAINT:  Shortness of breath, respiratory failure  BRIEF PATIENT DESCRIPTION: 36 year old female with history of CP who presents with 2 day history of change in behavior and increase SOB.  Patient was brought to the ED where she was noted to be desaturating and with increased WOB.  Patient was placed on BiPAP and given IVF for hypotension.  Vitals stabilized but continued to require BiPAP.  PCCM was called to consult.  Little other history is available  LINES / TUBES: BiPAP 3/29>>>neg  CULTURES: Blood 3/29>>>staph>> Urine 3/29>>>neg Sputum 3/29>>>unable  ANTIBIOTICS: Vancomycin 3/29>>>d/c 4/1 Zosyn 3/29>>> d/c 4/2 Levaquin 4/2 >>  SIGNIFICANT EVENTS:  3/29>>>respiratory failure requiring BiPAP  3/30 - remains on bipap. 50%, On low dose neo iv PIV. Mom and aunt and sister at bedsie. Ramaswamy used to take care of patient dad Mr Antonia Jicha (now deceased). Flu negative. HIV negative.  3/31 - off pressors. Gave lasix challenge - tolerated off Bipap ~2hrs; ordered echo  4/2 - tolerated off BiPAP for ~4 hrs after lasix yesterday. G1DD on Echo.  LEVEL OF CARE:  ICU PRIMARY SERVICE:  PCCM CONSULTANTS:  n/a CODE STATUS: DNR/DNI DIET:  NPO DVT Px:  scd's GI Px:  none  HISTORY OF PRESENT ILLNESS:   36 year old female with history of CP who presents with 2 day history of change in behavior and increase SOB. Patient was brought to the ED where she was noted to be desaturating and with increased WOB. Patient was placed on BiPAP and given IVF for hypotension.  Vitals stabilized but continued to require BiPAP. PCCM was called to consult.   PAST MEDICAL HISTORY :  Past Medical History:  Diagnosis Date  . Cerebral palsy ([place])   . Seizures ([place])    has seizures monthly.  Dr  Marlou Sa   [phone]    is her        Past Surgical History:  Procedure Laterality Date  . DENTAL SURGERY    . EXAMINATION UNDER ANESTHESIA  09/26/2011   Procedure: EXAM UNDER ANESTHESIA;  Surgeon: Ceasar Mons, DDS;  Location: [facility];  Service: Oral Surgery;  Laterality: N/A;  By Dr. Alease Medina   . TOOTH EXTRACTION  09/26/2011   Procedure: EXTRACTION MOLARS;  Surgeon: Ceasar Mons, DDS;  Location: [facility];  Service: Oral Surgery;  Laterality: N/A;  EXTRACTION OF TOOTH # 30 AND POSSIBLE OTHER TEETH   Prior to Admission medications   Medication Sig Start Date End Date Taking? Authorizing Provider  diazepam (DIASTAT ACUDIAL) 10 MG GEL Place 10 mg rectally every 6 (six) hours as needed. Post-seizure if Lorazepam does not subside anxiety, per pt's mother   Yes Historical Provider, MD  divalproex (DEPAKOTE SPRINKLE) 125 MG capsule Take 750 mg by mouth 2 (two) times daily.   Yes Historical Provider, MD  glycopyrrolate (ROBINUL) 1 MG tablet Take 1 mg by mouth 3 (three) times daily.   Yes Historical Provider, MD  ibuprofen (ADVIL,MOTRIN) 200 MG tablet Take 200 mg by mouth every 6 (six) hours as needed for fever or moderate pain.   Yes Historical Provider, MD  lactulose (CHRONULAC) 10 GM/15ML solution Take 30 g by mouth daily.   Yes Historical Provider, MD  LORazepam (ATIVAN) 1 MG tablet Take  1 mg by mouth every 8 (eight) hours as needed. For anxiety   Yes Historical Provider, MD  zonisamide (ZONEGRAN) 100 MG capsule Take 200 mg by mouth 2 (two) times daily.   Yes Historical Provider, MD   Allergies  Allergen Reactions  . Doxycycline Hyclate Swelling    REACTION: tongue swelling    FAMILY HISTORY:  No family history on file. SOCIAL HISTORY:  reports that she has never smoked. She has never used smokeless tobacco. She reports that she does not drink alcohol or use drugs.  REVIEW OF SYSTEMS:  Patient not able to communicate  INTERVAL HISTORY:  Continued drop in platelets today despite no  heparin.  VITAL SIGNS: Temp:  [97.7 F (36.5 C)-99.4 F (37.4 C)] 99.4 F (37.4 C) (04/03 0805) Pulse Rate:  [87-104] 98 (04/03 0913) Resp:  [14-29] 18 (04/03 0913) BP: (95-148)/(54-107) 148/77 (04/03 0913) SpO2:  [89 %-100 %] 97 % (04/03 0913) FiO2 (%):  [30 %-40 %] 40 % (04/03 0913) Weight:  [91 lb 11.4 oz (41.6 kg)] 91 lb 11.4 oz (41.6 kg) (04/03 0500) HEMODYNAMICS:   VENTILATOR SETTINGS: FiO2 (%):  [30 %-40 %] 40 % INTAKE / OUTPUT: Intake/Output      04/02 0701 - 04/03 0700 04/03 0701 - 04/04 0700   I.V. (mL/kg) 785 (18.9) 60 (1.4)   Other 50    IV Piggyback 632.5    Total Intake(mL/kg) 1467.5 (35.3) 60 (1.4)   Urine (mL/kg/hr) 1750 (1.8) 125 (1)   Total Output 1750 125   Net -282.5 -65          PHYSICAL EXAMINATION: General:  NAD Neuro:  Alert HEENT:  Poor dentition Cardiovascular:  RRR, no murmurs, rubs or gallops Lungs:  Bilateral breath sounds present, somewhat coarse but on bipap Abdomen:  Soft, NDNT Musculoskeletal:  Decreased muscle mass throughout Skin:  Skin breakdown on bridge of nose   LABS: Cbc  Recent Labs Lab 08/21/16 0440 08/22/16 0500 08/23/16 0315  WBC 24.0* 12.1* 11.2*  HGB 8.1* 7.8* 7.7*  HCT 25.3* 24.0* 23.8*  PLT 47* 34* 27*    Chemistry   Recent Labs Lab 08/20/16 0500 08/21/16 0440 08/22/16 0500 08/23/16 0315  NA 134* 130*  --  130*  K 3.2* 4.0  --  2.7*  CL 106 103  --  95*  CO2 24 24  --  31  BUN 10 10  --  7  CREATININE 0.37* 0.42*  --  0.35*  CALCIUM 7.6* 7.6*  --  7.4*  MG 2.5* 2.0 1.7 1.9  PHOS 2.1* 3.5 3.4 2.2*  GLUCOSE 144* 101*  --  229*    Liver fxn  Recent Labs Lab 08/18/16 1130 08/18/16 1602 08/21/16 0941  AST 18 20 16   ALT 8* 6* 8*  ALKPHOS 70 59 81  BILITOT 0.6 0.5 0.4  PROT 7.7 6.4* 5.6*  ALBUMIN 2.0* 1.6* 1.2*   coags  Recent Labs Lab 08/18/16 1602  APTT 34  INR 1.45   Sepsis markers  Recent Labs Lab 08/18/16 1149 08/18/16 1602 08/18/16 1731  LATICACIDVEN 2.05*  --  1.35   PROCALCITON  --  7.14  --    Cardiac markers No results for input(s): CKTOTAL, CKMB, TROPONINI in the last 168 hours. BNP No results for input(s): PROBNP in the last 168 hours. ABG  Recent Labs Lab 08/18/16 1223 08/19/16 0300  PHART 7.341* 7.255*  PCO2ART 46.4 50.0*  PO2ART 80.0* 86.0  HCO3 25.1 21.4  TCO2 26  --  CBG trend  Recent Labs Lab 08/22/16 1547 08/22/16 1931 08/23/16 0001 08/23/16 0317 08/23/16 0802  GLUCAP 98 101* 107* 202* 95    IMAGING:  ECG:  DIAGNOSES: Active Problems:   Respiratory failure (HCC)   Pressure injury of skin   Volume overload   ASSESSMENT / PLAN:  PULMONARY  ASSESSMENT: Acute respiratory failure on BiPAP, but tolerated off after Lasix   PLAN:   Attempt off BiPAP today DNI confirmed by Dr. Chase Caller 3/31  CARDIOVASCULAR  ASSESSMENT:  Septic shock on admission Has been off pressors since 3/31 Echo shows G1DD  PLAN:  Lasix 40mg  IV once today  RENAL  ASSESSMENT:   UOP 1.7L yesterday after lasix with improvement in venturi mask tolerance vs BiPAP. Renal function stable. Hypokalemia Hyponatremia Hypophosphatemia  PLAN:   Lasix 40mg  IV once today f/u AM Bmet Replete electrolytes as indicated  GASTROINTESTINAL  ASSESSMENT:   High aspiration risk esp with BiPAP. She had been tolerating off for increased amount of time yesterday.  PLAN:   Core trak placement today  HEMATOLOGIC  ASSESSMENT:   Stable; Hgb 7.8; no signs of bleeding. Plts 27 this AM  PLAN:  f/u AM CBC Monitor for bleeding  INFECTIOUS  ASSESSMENT:   BCx coag neg staph 1/2 - likely contaminant. Concern for aspiration pneumonia  PLAN:   f/u AM CBC Follow cultures Levofloxacin   ENDOCRINE  ASSESSMENT:   Has progressed with tolerating off Bipap yesterday; has not had PO intake in ~7 days. No further hypoglycemic episodes since yesterday.  PLAN:   D10 49ml/hr Core trak placement today Monitor CBG's  closely  NEUROLOGIC  ASSESSMENT:   No seizures  PLAN:   Keppra 1g IV BID Depakote 750mg  IV BID Ativan 0.5mg  IV Q8 PRN  CLINICAL SUMMARY:   I have personally obtained a history, examined the patient, evaluated laboratory and imaging results, formulated the assessment and plan and placed orders. CRITICAL CARE: The patient is critically ill with multiple organ systems failure and requires high complexity decision making for assessment and support, frequent evaluation and titration of therapies, application of advanced monitoring technologies and extensive interpretation of multiple databases. Critical Care Time devoted to patient care services described in this note is 33 minutes.    Pulmonary and [place] Pager: [phone]  08/23/2016, 10:10 AM

## 2016-08-23 NOTE — Progress Notes (Signed)
Initial Nutrition Assessment  DOCUMENTATION CODES:   Not applicable  INTERVENTION:    Jevity 1.2 at 55 ml/h (1320 ml per day)   Provides 1584 kcal, 73 gm protein, 1069 ml free water daily  NUTRITION DIAGNOSIS:   Inadequate oral intake related to inability to eat as evidenced by NPO status.  GOAL:   Patient will meet greater than or equal to 90% of their needs  MONITOR:   TF tolerance, Skin, Labs, I & O's  REASON FOR ASSESSMENT:   Consult Enteral/tube feeding initiation and management  ASSESSMENT:   36 year old female with history of CP admitted 3/29 with 2 day history of change in behavior and increase SOB.  Patient was brought to the ED where she was noted to be desaturating and with increased WOB.  Patient was placed on BiPAP and given IVF for hypotension.   Discussed patient with RN today. Cortrak has been placed with tip in the duodenal jejunal junction vs stomach. Post-pyloric feeding not required.  Received MD Consult for TF initiation and management. Mom reports that patient's weight has been stable and she usually eats very well, but requires her food to be chopped into small pieces. She walks around with Mom's assistance, but unable to walk alone.  Did not perform nutrition focused physical exam, expect muscle depletion with limited activity. Labs reviewed: sodium 130 (L), potassium 2.7 (L), phosphorus 2.2 (L) Medications reviewed and include lactulose, potassium phosphate.  Diet Order:  Diet NPO time specified  Skin:  Wound (see comment) (stage II to nose)  Last BM:  4/1  Height:   Ht Readings from Last 1 Encounters:  08/18/16 4\' 11"  (1.499 m)    Weight:   Wt Readings from Last 1 Encounters:  08/23/16 91 lb 11.4 oz (41.6 kg)    Ideal Body Weight:  44.7 kg  BMI:  Body mass index is 18.52 kg/m.  Estimated Nutritional Needs:   Kcal:  1400-1600  Protein:  70-80 gm  Fluid:  1.5 L  EDUCATION NEEDS:   No education needs identified at this  time  Molli Barrows, [place], [place], [place] Pager [phone] After Hours Pager [phone]

## 2016-08-23 NOTE — Care Management Note (Signed)
Case Management Note  Patient Details  Name: DEVI HOPMAN MRN: [id number] Date of Birth: May 06, 1981  Subjective/Objective:   Pt admitted with RF and AMS                 Action/Plan:   36 year old female with CP who is severely debilitated. Requires continuous BIPAP - very high risk for intubation.  No family at bedside.  CM will continue to monitor for discharge needs   Expected Discharge Date:                  Expected Discharge Plan:     In-House Referral:     Discharge planning Services  CM Consult  Post Acute Care Choice:    Choice offered to:     DME Arranged:    DME Agency:     HH Arranged:    HH Agency:     Status of Service:  In process, will continue to follow  If discussed at Long Length of Stay Meetings, dates discussed:    Additional Comments:  Maryclare Labrador, RN 08/23/2016, 3:48 PM

## 2016-08-23 NOTE — Progress Notes (Signed)
CRITICAL VALUE ALERT  Critical value received:  Potassium 2.7/platelets 27  Date of notification:  08/23/16  Time of notification:  5638  Critical value read back: yes  Nurse who received alert:  Deboraha Sprang, RN  MD notified (1st page):  Dr. Teodora Medici  Time of first page: [phone]  Time MD responded: [phone]

## 2016-08-23 NOTE — Progress Notes (Signed)
K 2.7 --> ordered 68meq K+ IV.  plt 27, no active bleeding. Cont to monitor. Consider plt transfusion if decreases further.

## 2016-08-23 NOTE — Progress Notes (Signed)
PULMONARY / CRITICAL CARE MEDICINE   Name: Michelle Oconnor MRN: [id number] DOB: [birth year]    ADMISSION DATE:  08/18/2016 CONSULTATION DATE:  08/18/2016  REFERRING MD:  Michelle Oconnor Michelle Oconnor  CHIEF COMPLAINT:  SOB and respiratory failure  BRIEF SUMMARY:  36 year old female with history of CP admitted 3/29 with 2 day history of change in behavior and increase SOB.  Patient was brought to the ED where she was noted to be desaturating and with increased WOB.  Patient was placed on BiPAP and given IVF for hypotension.  Vitals stabilized but continued to require BiPAP.  PCCM was called to consult.     SUBJECTIVE:  Net neg 280 for last 24 hours, remains 12.8L positive for admit.  RN reports pt's K low this am.  Required intermittent bipap overnight.  Mom concerned about feeding / nutrition.    VITAL SIGNS: BP 117/72   Pulse 93   Temp 99.4 F (37.4 C) (Axillary)   Resp 17   Ht 4\' 11"  (1.499 m)   Wt 91 lb 11.4 oz (41.6 kg)   SpO2 98%   BMI 18.52 kg/m   HEMODYNAMICS:    VENTILATOR SETTINGS: FiO2 (%):  [30 %-40 %] 40 %  INTAKE / OUTPUT: I/O last 3 completed shifts: In: 2292 [I.V.:1062; Other:50; IV Piggyback:1180] Out: 1750 [Urine:1750]  PHYSICAL EXAMINATION: General: chronically ill appearing female in NAD HEENT: MM pink/dry, poor dentition PSY: calm Neuro: eyes open, moves head side to side, chronic contractures  CV: s1s2 rrr, no m/r/g PULM: even/non-labored, lungs bilaterally coarse rhonchi AC:ZYSA, non-tender, bsx4 active  Extremities: warm/dry, trace edema  Skin: no rashes or lesions   LABS: PULMONARY  Recent Labs Lab 08/18/16 1223 08/19/16 0300  PHART 7.341* 7.255*  PCO2ART 46.4 50.0*  PO2ART 80.0* 86.0  HCO3 25.1 21.4  TCO2 26  --   O2SAT 95.0 95.5   CBC  Recent Labs Lab 08/21/16 0440 08/22/16 0500 08/23/16 0315  HGB 8.1* 7.8* 7.7*  HCT 25.3* 24.0* 23.8*  WBC 24.0* 12.1* 11.2*  PLT 47* 34* 27*    COAGULATION  Recent Labs Lab 08/18/16 1602  INR  1.45    CARDIAC  No results for input(s): TROPONINI in the last 168 hours. No results for input(s): PROBNP in the last 168 hours.   CHEMISTRY  Recent Labs Lab 08/19/16 0303 08/19/16 1642 08/20/16 0500 08/21/16 0440 08/22/16 0500 08/23/16 0315  NA 139 139 134* 130*  --  130*  K 2.1* 4.1 3.2* 4.0  --  2.7*  CL 114* 113* 106 103  --  95*  CO2 20* 22 24 24   --  31  GLUCOSE 86 102* 144* 101*  --  229*  BUN 9 9 10 10   --  7  CREATININE 0.40* 0.42* 0.37* 0.42*  --  0.35*  CALCIUM 6.7* 7.6* 7.6* 7.6*  --  7.4*  MG 1.4*  --  2.5* 2.0 1.7 1.9  PHOS 2.6  --  2.1* 3.5 3.4 2.2*   Estimated Creatinine Clearance: 63.2 mL/min (A) (by C-G formula based on SCr of 0.35 mg/dL (L)).  LIVER  Recent Labs Lab 08/18/16 1130 08/18/16 1602 08/21/16 0941  AST 18 20 16   ALT 8* 6* 8*  ALKPHOS 70 59 81  BILITOT 0.6 0.5 0.4  PROT 7.7 6.4* 5.6*  ALBUMIN 2.0* 1.6* 1.2*  INR  --  1.45  --    INFECTIOUS  Recent Labs Lab 08/18/16 1149 08/18/16 1602 08/18/16 1731  LATICACIDVEN 2.05*  --  1.35  PROCALCITON  --  7.14  --    ENDOCRINE CBG (last 3)   Recent Labs  08/23/16 0001 08/23/16 0317 08/23/16 0802  GLUCAP 107* 202* 95   IMAGING x48h Dg Chest Port 1 View  Result Date: 08/22/2016 CLINICAL DATA:  Fever and recent weight gain EXAM: PORTABLE CHEST 1 VIEW COMPARISON:  August 21, 2016 FINDINGS: Central catheter tip is in the superior vena cava. No pneumothorax. There is persistent right pleural effusion. There is airspace consolidation in the lower lung regions bilaterally, somewhat more on the left than on the right. Heart is upper normal in size with pulmonary vascularity within normal limits. No adenopathy. No bone lesions. IMPRESSION: Space consolidation consistent with pneumonia both lower lobes, more on the left on the right. Stable moderate pleural effusion on the right. Stable cardiac silhouette. Central catheter tip in superior vena cava without pneumothorax. Electronically Signed    By: Lowella Grip III M.D.   On: 08/22/2016 09:39    STUDIES:  ECHO 4/2 >> LVEF 60-65%, normal wall motion, grade 1 diastolic dysfunction  CULTURES: Blood 3/29 >> coag neg staph 1/2 >> Urine 3/29 >> neg Sputum 3/29 >> unable  ANTIBIOTICS: Vancomycin 3/29 >> 4/01 Zosyn 3/29 >> 4/2 Tamiflu 3/29 >> off Levaquin 4/2 >>   LINES/TUBES: BiPAP 3/29 >> neg  SIGNIFICANT EVENTS: 3/29  Admit with respiratory failure requiring BiPAP 3/30  BiPAP 50%, On low dose neo PIV.  3/31  Off pressors.  4/01  Thrombocytopenia >?valproic; home med. Mom requesting neuro consult. Still on bipap / No seizues.  4/03  Intermittently requiring bipap    DISCUSSION: 36 year old female with CP who is severely debilitated.  Presenting with ARDS and I am concerned it could be a respiratory virus.  Requiring BiPAP and with her anatomy I am afraid she will be an extremely difficult airway.  ASSESSMENT / PLAN:  PULMONARY A: Acute Hypoxic Respiratory Failure  BiPAP use P:   BiPAP PRN for increased work of breathing  O2 as needed to support sats 88-92% DNI  ECHO as above Pulmonary hygiene as able > turning, upright positioning Trend CXR  CARDIOVASCULAR A:  Septic shock - resolved Grade I Diastolic Dysfunction  P:  ICU monitoring of hemodynamics  KVO IVF DNR in the event of arrest, no cardioversion, pressors or central access Lasix as below   RENAL A:   Hypokalemia  Hyponatremia Hypophosphatemia  P:   Lasix 40 mg IV x1 Trend BMP / urinary output Replace electrolytes as indicated Avoid nephrotoxic agents, ensure adequate renal perfusion  GASTROINTESTINAL A:   Aspiration Risk  P:   NPO while on BiPAP High risk aspiration  Begin TF via small bore feeding tube  TPN as last resort  Resume home lactulose once feeding tube inserted  HEMATOLOGIC A:   Anemia  Thrombocytoepnia - not on heparin, ? Valproate  P:  Trend CBC Transfuse per ICU guidelines  Monitor for bleeding    INFECTIOUS A:   Concern for aspiration pneumonitis vs viral infection VCAP . Flu negtaive. Family denies aspiration Stage II Decubitus Ulcer - bridge of nose secondary to bipap mask P:   Follow cultures as above Abx as above, D6/x  WOC for decubitus ulcer > minimize bipap use as able   ENDOCRINE A:   Hypoglycemia   P:   D10 at 30 ml/hr Monitor glucose closely   NEUROLOGIC A:   CP by history, unable to assess if altered but exam is non-focal and neck is  supple Seizure disorder by history on zonegram, depakoate and ativan P:   Continue Keppra  Depakote 750mg  IV BID (home med) Ativan 0.5mg  IV Q8 Minimize sedating medications as able Resume home Zonegran when able to take PO's   FAMILY  - Mother updated at bedside 4/3.    - GOC:  DNR/DNI   NP CC Time: 72 minutes   Noe Gens, NP-C [place] [facility] Pgr: [phone] or if no answer [phone] 08/23/2016, 8:18 AM  Attending Note:  36 year old female with CP who presents with PNA and respiratory failure requiring BiPAP.  Patient was able to stay off BiPAP for 4.5 hours yesterday with diureses and continuation of abx.  On exam, patient continues to have crackles and remains BiPAP dependent.  I reviewed CXR myself, infiltrate noted.  Will continue abx.  Give another dose of lasix 40 mg IV x1.  Will place cor-trak today and start TF assuming seal remains.  Spoke with mother, will attempt further diureses x1 more day and if remains BiPAP dependent by AM then will need to discuss plan of care and will likely recommend comfort at this point.  The patient is critically ill with multiple organ systems failure and requires high complexity decision making for assessment and support, frequent evaluation and titration of therapies, application of advanced monitoring technologies and extensive interpretation of multiple databases.   Critical Care Time devoted to patient care services described in this note is  35  Minutes.  This time reflects time of care of this signee Dr Jennet Maduro. This critical care time does not reflect procedure time, or teaching time or supervisory time of PA/NP/Med student/Med Resident etc but could involve care discussion time.  Rush Farmer, M.D. [facility] Pulmonary/Critical Care Medicine. Pager: [phone]. After hours pager: [phone].

## 2016-08-24 ENCOUNTER — Inpatient Hospital Stay (HOSPITAL_COMMUNITY): Payer: Medicare Other

## 2016-08-24 DIAGNOSIS — J9811 Atelectasis: Secondary | ICD-10-CM

## 2016-08-24 LAB — GLUCOSE, CAPILLARY
GLUCOSE-CAPILLARY: 170 mg/dL — AB (ref 65–99)
GLUCOSE-CAPILLARY: 148 mg/dL — AB (ref 65–99)
GLUCOSE-CAPILLARY: 167 mg/dL — AB (ref 65–99)
Glucose-Capillary: 163 mg/dL — ABNORMAL HIGH (ref 65–99)
Glucose-Capillary: 177 mg/dL — ABNORMAL HIGH (ref 65–99)

## 2016-08-24 LAB — CBC
HCT: 24.7 % — ABNORMAL LOW (ref 36.0–46.0)
Hemoglobin: 7.9 g/dL — ABNORMAL LOW (ref 12.0–15.0)
MCH: 32.8 pg (ref 26.0–34.0)
MCHC: 32 g/dL (ref 30.0–36.0)
MCV: 102.5 fL — ABNORMAL HIGH (ref 78.0–100.0)
PLATELETS: 26 10*3/uL — AB (ref 150–400)
RBC: 2.41 MIL/uL — AB (ref 3.87–5.11)
RDW: 14.1 % (ref 11.5–15.5)
WBC: 11.4 10*3/uL — AB (ref 4.0–10.5)

## 2016-08-24 LAB — BASIC METABOLIC PANEL
Anion gap: 3 — ABNORMAL LOW (ref 5–15)
BUN: 8 mg/dL (ref 6–20)
CALCIUM: 7.6 mg/dL — AB (ref 8.9–10.3)
CO2: 31 mmol/L (ref 22–32)
CREATININE: 0.31 mg/dL — AB (ref 0.44–1.00)
Chloride: 100 mmol/L — ABNORMAL LOW (ref 101–111)
Glucose, Bld: 188 mg/dL — ABNORMAL HIGH (ref 65–99)
Potassium: 3.2 mmol/L — ABNORMAL LOW (ref 3.5–5.1)
SODIUM: 134 mmol/L — AB (ref 135–145)

## 2016-08-24 LAB — PHOSPHORUS
PHOSPHORUS: 2.3 mg/dL — AB (ref 2.5–4.6)
PHOSPHORUS: 2.6 mg/dL (ref 2.5–4.6)

## 2016-08-24 LAB — MAGNESIUM
MAGNESIUM: 2.4 mg/dL (ref 1.7–2.4)
Magnesium: 1.8 mg/dL (ref 1.7–2.4)

## 2016-08-24 MED ORDER — VALPROATE SODIUM 250 MG/5ML PO SOLN
750.0000 mg | Freq: Two times a day (BID) | ORAL | Status: DC
  Administered 2016-08-24: 750 mg
  Filled 2016-08-24 (×2): qty 15

## 2016-08-24 MED ORDER — ACETYLCYSTEINE 20 % IN SOLN
3.0000 mL | Freq: Three times a day (TID) | RESPIRATORY_TRACT | Status: DC
  Administered 2016-08-24: 4 mL via RESPIRATORY_TRACT
  Administered 2016-08-24 – 2016-08-25 (×2): 3 mL via RESPIRATORY_TRACT
  Filled 2016-08-24 (×4): qty 4

## 2016-08-24 MED ORDER — MAGNESIUM SULFATE 2 GM/50ML IV SOLN
2.0000 g | Freq: Once | INTRAVENOUS | Status: AC
  Administered 2016-08-24: 2 g via INTRAVENOUS
  Filled 2016-08-24: qty 50

## 2016-08-24 MED ORDER — DEXTROSE 5 % IV SOLN
20.0000 meq | Freq: Once | INTRAVENOUS | Status: AC
  Administered 2016-08-24: 20 meq via INTRAVENOUS
  Filled 2016-08-24: qty 4.55

## 2016-08-24 MED ORDER — DIVALPROEX SODIUM 125 MG PO CSDR
750.0000 mg | DELAYED_RELEASE_CAPSULE | Freq: Two times a day (BID) | ORAL | Status: DC
  Filled 2016-08-24: qty 6

## 2016-08-24 NOTE — Progress Notes (Signed)
Patient is currently on [place] with sats of 96%. All vitals are stable and patient is in no distress. BIPAP is in room but is not needed at this time. Will continue to monitor.

## 2016-08-24 NOTE — Progress Notes (Signed)
CPT completed by vest followed by using a ridged hard yaunker for deep oral suctioning.  Pt tolerated fairly well and a large brown/tan/pink tinged plug was removed.

## 2016-08-24 NOTE — Progress Notes (Signed)
Pink rash and edema noted to pt's right arm. Dr. Nelda Marseille made aware.

## 2016-08-24 NOTE — Progress Notes (Addendum)
PULMONARY  / CRITICAL CARE MEDICINE  Name: Michelle Oconnor MRN: [id number] DOB: 28-Nov-1980    PLEASE REFER TO ATTENDING NOTE FOR THE DAY FOR UPDATE INFO AND PLAN.  LOS: 17  REFERRING MD :  Michelle Oconnor  CHIEF COMPLAINT:  Shortness of breath, respiratory failure  BRIEF PATIENT DESCRIPTION: 36 year old female with history of CP who presents with 2 day history of change in behavior and increase SOB.  Patient was brought to the ED where she was noted to be desaturating and with increased WOB.  Patient was placed on BiPAP and given IVF for hypotension.  Vitals stabilized but continued to require BiPAP.  PCCM was called to consult.  Little other history is available  LINES / TUBES: BiPAP 3/29>>>neg  CULTURES: Blood 3/29>>>staph>> Urine 3/29>>>no growth Urine 4/1>>> no growth Resp Panel 3/29>>>negative  ANTIBIOTICS: Vancomycin 3/29>>>d/c 4/1 Zosyn 3/29>>> d/c 4/2 Levaquin 4/2 >>  SIGNIFICANT EVENTS:  3/29>>>respiratory failure requiring BiPAP  3/30 - remains on bipap. 50%, On low dose neo iv PIV. Mom and aunt and sister at bedsie. Michelle Oconnor used to take care of patient dad Michelle Oconnor (now deceased). Flu negative. HIV negative.  3/31 - off pressors. Gave lasix challenge - tolerated off Bipap ~2hrs; ordered echo  4/2 - tolerated off BiPAP for ~4 hrs after lasix yesterday. G1DD on Echo.  4/3 - off BiPAP and on HFNC with good tolerance after Lasix  HISTORY OF PRESENT ILLNESS:   36 year old female with history of CP who presents with 2 day history of change in behavior and increase SOB. Patient was brought to the ED where she was noted to be desaturating and with increased WOB. Patient was placed on BiPAP and given IVF for hypotension.  Vitals stabilized but continued to require BiPAP. PCCM was called to consult.   INTERVAL HISTORY:  Continued drop in platelets today despite no heparin.  Atelectatic left lung today  VITAL SIGNS: Temp:  [98.1 F (36.7 C)-98.9 F (37.2 C)] 98.1  F (36.7 C) (04/04 0747) Pulse Rate:  [86-104] 100 (04/04 0800) Resp:  [13-30] 25 (04/04 0800) BP: (72-148)/(44-77) 101/50 (04/04 0800) SpO2:  [91 %-100 %] 97 % (04/04 0800) FiO2 (%):  [40 %] 40 % (04/03 0913)  HEMODYNAMICS: CVP:  [6 mmHg] 6 mmHg  VENTILATOR SETTINGS: FiO2 (%):  [40 %] 40 %  INTAKE / OUTPUT: Intake/Output      04/03 0701 - 04/04 0700 04/04 0701 - 04/05 0700   I.V. (mL/kg) 499 (12) 20 (0.5)   Other     NG/GT 751.6 350   IV Piggyback 639.6    Total Intake(mL/kg) 1890.1 (45.4) 370 (8.9)   Urine (mL/kg/hr) 1200 (1.2)    Stool 0 (0) 300 (3.7)   Total Output 1200 300   Net +690.1 +70        Urine Occurrence 2 x    Stool Occurrence 3 x     PHYSICAL EXAMINATION: General:  NAD, off BiPAP Neuro:  Awake, moving all ext HEENT:  Poor dentition Cardiovascular:  RRR, no murmurs, rubs or gallops Lungs:  Bilateral breath sounds present Abdomen:  Soft, NT, ND and +BS Musculoskeletal:  Decreased muscle mass throughout Skin:  Skin breakdown on bridge of nose  LABS: Cbc  Recent Labs Lab 08/22/16 0500 08/23/16 0315 08/24/16 0506  WBC 12.1* 11.2* 11.4*  HGB 7.8* 7.7* 7.9*  HCT 24.0* 23.8* 24.7*  PLT 34* 27* 26*   Chemistry  Recent Labs Lab 08/21/16 0440  08/23/16 0315 08/23/16 0901  08/23/16 1622 08/24/16 0506  NA 130*  --  130*  --   --  134*  K 4.0  --  2.7*  --   --  3.2*  CL 103  --  95*  --   --  100*  CO2 24  --  31  --   --  31  BUN 10  --  7  --   --  8  CREATININE 0.42*  --  0.35*  --   --  0.31*  CALCIUM 7.6*  --  7.4*  --   --  7.6*  MG 2.0  < > 1.9 1.7 1.7 1.8  PHOS 3.5  < > 2.2* 2.3* 6.3* 2.3*  GLUCOSE 101*  --  229*  --   --  188*  < > = values in this interval not displayed.  Liver fxn  Recent Labs Lab 08/18/16 1130 08/18/16 1602 08/21/16 0941  AST 18 20 16   ALT 8* 6* 8*  ALKPHOS 70 59 81  BILITOT 0.6 0.5 0.4  PROT 7.7 6.4* 5.6*  ALBUMIN 2.0* 1.6* 1.2*   coags  Recent Labs Lab 08/18/16 1602  APTT 34  INR 1.45    Sepsis markers  Recent Labs Lab 08/18/16 1149 08/18/16 1602 08/18/16 1731  LATICACIDVEN 2.05*  --  1.35  PROCALCITON  --  7.14  --    Cardiac markers No results for input(s): CKTOTAL, CKMB, TROPONINI in the last 168 hours. BNP No results for input(s): PROBNP in the last 168 hours. ABG  Recent Labs Lab 08/18/16 1223 08/19/16 0300  PHART 7.341* 7.255*  PCO2ART 46.4 50.0*  PO2ART 80.0* 86.0  HCO3 25.1 21.4  TCO2 26  --    CBG trend  Recent Labs Lab 08/23/16 1538 08/23/16 2012 08/23/16 2351 08/24/16 0346 08/24/16 0745  GLUCAP 170* 161* 137* 170* 163*   IMAGING: 4/4 - CXR - complete opacification of left hemithorax  ECG:  DIAGNOSES: Active Problems:   Respiratory failure (HCC)   Pressure injury of skin   Volume overload  I reviewed CXR myself, left lung whiteout.  ASSESSMENT / PLAN:  PULMONARY  ASSESSMENT: Has been off bipap since yesterday and tolerating HFNC well.  CXR today with complete opacification of left hemithorax  PLAN:   DNI confirmed by Dr. Nelda Oconnor 3/31 Vibravest Suction for mucous plug Mucomyst  CARDIOVASCULAR  ASSESSMENT:  Septic shock on admission Has been off pressors since 3/31 Echo shows G1DD  PLAN:  Hold further diureses   RENAL  ASSESSMENT:   UOP 1.7L yesterday after lasix with improvement in venturi mask tolerance vs BiPAP. Renal function stable. Hypokalemia improving Hyponatremia improving Hypophosphatemia  PLAN:   F/u AM Bmet Replete electrolytes as indicated  GASTROINTESTINAL  ASSESSMENT:   Has been off of BiPAP since yesterday AM and had placement of Core Trak.  PLAN:   Core trak TF per nutrition  HEMATOLOGIC  ASSESSMENT:   Stable; Hgb 7.9; no signs of bleeding. Plts 26 this AM Thrombocytopenia  PLAN:  f/u AM CBC Monitor for bleeding Hold heparin  INFECTIOUS  ASSESSMENT:   BCx coag neg staph 1/2 - likely contaminant. Concern for aspiration pneumonia  PLAN:   f/u AM CBC Follow  cultures Levofloxacin for total of 8 days  ENDOCRINE  ASSESSMENT:   Core trak placed yesterday - did not require BiPAP again so was able to keep in  PLAN:   Core trak placement Monitor CBG's closely  NEUROLOGIC  ASSESSMENT:   No seizures  PLAN:  D/C Keppra 1g IV BID Restart zonegran home dose Depakote changed PO 750 mg PO BID Ativan 0.5mg  IV Q8 PRN  CLINICAL SUMMARY: Spoke with mother at length, changed to full DNR.  Vibravest.  If deteriorates then will proceed to comfort care.  Discussed with PCCM-resident and TRH-MD.   Transfer to SDU and to [facility] service with PCCM off 4/5.  Rush Farmer, M.D. [facility] Pulmonary/Critical Care Medicine. Pager: [phone]. After hours pager: [phone].  08/24/2016, 8:57 AM

## 2016-08-24 NOTE — Progress Notes (Signed)
Patient is currently on [place] with sats of 97%. All vitals are stable and patient is in no distress. BIPAP in room but is not needed at this time. Will continue to monitor.

## 2016-08-25 ENCOUNTER — Encounter (HOSPITAL_COMMUNITY): Payer: Self-pay | Admitting: *Deleted

## 2016-08-25 ENCOUNTER — Inpatient Hospital Stay (HOSPITAL_COMMUNITY): Payer: Medicare Other

## 2016-08-25 DIAGNOSIS — Z7189 Other specified counseling: Secondary | ICD-10-CM

## 2016-08-25 DIAGNOSIS — E876 Hypokalemia: Secondary | ICD-10-CM

## 2016-08-25 DIAGNOSIS — J690 Pneumonitis due to inhalation of food and vomit: Secondary | ICD-10-CM

## 2016-08-25 DIAGNOSIS — D696 Thrombocytopenia, unspecified: Secondary | ICD-10-CM

## 2016-08-25 DIAGNOSIS — D539 Nutritional anemia, unspecified: Secondary | ICD-10-CM

## 2016-08-25 DIAGNOSIS — Z515 Encounter for palliative care: Secondary | ICD-10-CM

## 2016-08-25 DIAGNOSIS — J90 Pleural effusion, not elsewhere classified: Secondary | ICD-10-CM

## 2016-08-25 LAB — BASIC METABOLIC PANEL
Anion gap: 3 — ABNORMAL LOW (ref 5–15)
BUN: 9 mg/dL (ref 6–20)
CO2: 29 mmol/L (ref 22–32)
Calcium: 7.4 mg/dL — ABNORMAL LOW (ref 8.9–10.3)
Chloride: 102 mmol/L (ref 101–111)
Creatinine, Ser: <0.3 mg/dL — ABNORMAL LOW (ref 0.44–1.00)
Glucose, Bld: 175 mg/dL — ABNORMAL HIGH (ref 65–99)
Potassium: 2.9 mmol/L — ABNORMAL LOW (ref 3.5–5.1)
Sodium: 134 mmol/L — ABNORMAL LOW (ref 135–145)

## 2016-08-25 LAB — CBC
HCT: 24.7 % — ABNORMAL LOW (ref 36.0–46.0)
HEMOGLOBIN: 7.9 g/dL — AB (ref 12.0–15.0)
MCH: 32.9 pg (ref 26.0–34.0)
MCHC: 32 g/dL (ref 30.0–36.0)
MCV: 102.9 fL — ABNORMAL HIGH (ref 78.0–100.0)
Platelets: 29 10*3/uL — CL (ref 150–400)
RBC: 2.4 MIL/uL — AB (ref 3.87–5.11)
RDW: 14.4 % (ref 11.5–15.5)
WBC: 11.7 10*3/uL — ABNORMAL HIGH (ref 4.0–10.5)

## 2016-08-25 LAB — PHOSPHORUS: Phosphorus: 2.4 mg/dL — ABNORMAL LOW (ref 2.5–4.6)

## 2016-08-25 LAB — GLUCOSE, CAPILLARY
Glucose-Capillary: 181 mg/dL — ABNORMAL HIGH (ref 65–99)
Glucose-Capillary: 134 mg/dL — ABNORMAL HIGH (ref 65–99)

## 2016-08-25 LAB — MAGNESIUM: Magnesium: 2.3 mg/dL (ref 1.7–2.4)

## 2016-08-25 MED ORDER — INSULIN ASPART 100 UNIT/ML ~~LOC~~ SOLN
0.0000 [IU] | SUBCUTANEOUS | Status: DC
  Administered 2016-08-25: 2 [IU] via SUBCUTANEOUS
  Administered 2016-08-25: 1 [IU] via SUBCUTANEOUS

## 2016-08-25 MED ORDER — MORPHINE SULFATE (PF) 2 MG/ML IV SOLN: 0.2500 mg | INTRAVENOUS | Status: DC | PRN

## 2016-08-25 MED ORDER — MORPHINE SULFATE (PF) 2 MG/ML IV SOLN: 0.5000 mg | INTRAVENOUS | Status: DC | PRN

## 2016-08-25 MED ORDER — LORAZEPAM 2 MG/ML IJ SOLN
1.0000 mg | Freq: Three times a day (TID) | INTRAMUSCULAR | Status: DC
  Administered 2016-08-25 – 2016-08-26 (×2): 1 mg via INTRAVENOUS
  Filled 2016-08-25 (×2): qty 1

## 2016-08-25 MED ORDER — MORPHINE SULFATE (CONCENTRATE) 10 MG/0.5ML PO SOLN: 5.0000 mg | ORAL | Status: DC | PRN

## 2016-08-25 MED ORDER — LORAZEPAM 2 MG/ML PO CONC: 1.0000 mg | Freq: Three times a day (TID) | ORAL | Status: DC

## 2016-08-25 MED ORDER — LORAZEPAM 1 MG PO TABS: 1.0000 mg | ORAL_TABLET | Freq: Three times a day (TID) | ORAL | Status: DC

## 2016-08-25 MED ORDER — LORAZEPAM 1 MG PO TABS
1.0000 mg | ORAL_TABLET | Freq: Three times a day (TID) | ORAL | Status: DC
Start: 2016-08-25 — End: 2016-08-25

## 2016-08-25 MED ORDER — MORPHINE SULFATE (PF) 2 MG/ML IV SOLN: 1.0000 mg | INTRAVENOUS | Status: DC | PRN

## 2016-08-25 MED ORDER — POTASSIUM CHLORIDE 20 MEQ/15ML (10%) PO SOLN
40.0000 meq | Freq: Every day | ORAL | Status: DC
  Filled 2016-08-25: qty 30

## 2016-08-25 MED ORDER — LORAZEPAM 2 MG/ML IJ SOLN
1.0000 mg | Freq: Three times a day (TID) | INTRAMUSCULAR | Status: DC
  Administered 2016-08-25: 1 mg via INTRAVENOUS
  Filled 2016-08-25: qty 1

## 2016-08-25 MED ORDER — LORAZEPAM 2 MG/ML PO CONC
1.0000 mg | Freq: Three times a day (TID) | ORAL | Status: DC
  Filled 2016-08-25: qty 0.5

## 2016-08-25 MED ORDER — MORPHINE SULFATE (PF) 2 MG/ML IV SOLN
0.5000 mg | INTRAVENOUS | Status: DC | PRN
  Administered 2016-08-25: 0.5 mg via INTRAVENOUS
  Filled 2016-08-25: qty 1

## 2016-08-25 MED ORDER — VALPROATE SODIUM 500 MG/5ML IV SOLN
750.0000 mg | Freq: Two times a day (BID) | INTRAVENOUS | Status: DC
  Administered 2016-08-25: 750 mg via INTRAVENOUS
  Filled 2016-08-25: qty 7.5

## 2016-08-25 MED ORDER — SODIUM CHLORIDE 0.9 % IV SOLN
30.0000 meq | Freq: Once | INTRAVENOUS | Status: AC
  Administered 2016-08-25: 30 meq via INTRAVENOUS
  Filled 2016-08-25 (×2): qty 15

## 2016-08-25 MED ORDER — LORAZEPAM 2 MG/ML PO CONC: 0.5000 mg | Freq: Three times a day (TID) | ORAL | Status: DC

## 2016-08-25 NOTE — Progress Notes (Signed)
Attempted to call report. RN will call back. 

## 2016-08-25 NOTE — Progress Notes (Signed)
Family refuses anything oral, even sublingual medications. Explanation given that SL meds are only 1/2 cc and are not "swallowed", but they still refused. KJKG, NP [facility]

## 2016-08-25 NOTE — Progress Notes (Addendum)
Pt arrived on unit. Pt made comfortable and call bell within reach. Pt's family did not want pt turned at this time, skin not completely assessed.

## 2016-08-25 NOTE — Consult Note (Signed)
[facility]: Pt is in room with her mother and aunt and other friends. Discussed hospice services and the pt 's options. They are wanting to take pt hoe and keep her as comfortable as possible. They have a DNR in place and are in agreement to the hospice philosophy.  Spoke to the [employer] and will follow up with the pt in the morning before discharge. Tenative plan at this tie to transfer pt by ambulance at 1200pm.  Equipment being ordered for delivery today of oxygen, hospital bed and OBT.   [facility] Liaison RN,MSW [phone]

## 2016-08-25 NOTE — Progress Notes (Addendum)
PROGRESS NOTE  Michelle Oconnor  ZOX:[id number] DOB: 04/18/1981 DOA: 08/18/2016 PCP: Michelle Pao, MD  Brief Narrative:   The patient is a 36 year old female with history of CP, epilepsy, who is taking care of by her mother who presented to the hospital with a 2 day history of change in behavior and increased shortness of breath. In the emergency department, she was placed on BiPAP and given IV fluids for hypotension.  Her chest x-ray demonstrated bilateral pneumonia with complete opacification of the left lung.  She was admitted to the ICU and placed on broad-spectrum antibiotics for pneumonia.  Due to progressive and worsening thrombocytopenia, she was unable to undergo thoracentesis for diagnostic and therapeutic purposes.  She initially required vasopressors which were titrated off however she remained hypotensive in the 81X to 91Y systolic. She was given intermittent Lasix which resulted in some diuresis but she became hypotensive to the 78G systolic. She was able to transition from BiPAP to high flow nasal cannula after 6 days of therapy. Critical care had repeated conversations about goals of care with the patient's family, and the decision was made on 4/5 to transition to comfort measures because it is unlikely she will recover from this infection. Family is sad but understanding.  They have elected to take her home with hospice.  Assessment & Plan:   Active Problems:   Respiratory failure (HCC)   Pressure injury of skin   Volume overload  Acute hypoxic respiratory failure and septic shock secondary to severe community acquired pneumonia with large left pleural effusion.  Fevers, leukocytosis have improved, but persistent metabolic encephalopathy, tachypnea, hypotension. -  Continue high flow nasal cannula until discharge and then will plan to send home on 5-6 L nasal cannula -  Unable to perform thoracentesis secondary to persistent thrombocytopenia -  Discontinue levofloxacin -   No Lasix secondary to hypotension -  Family is anxious about the possibility of her developing shortness of breath at home:  Palliative care consult and consult placed to case management for home hospice choice  Dysphagia -  Unable to tolerate oral medications or food -  DC [place] for comfort -  Transition to sublingual medications  Generalized weakness -  Continue foley catheter and rectal tube for comfort for now  Epilepsy -  Case discussed with on-call neurologist Michelle Oconnor  -  Patient will not be able to take her Zonegran and valproic acid  -  Plan to schedule SL ativan to prevent seizures -  Continue prn diastat  Macrocytic anemia, likely due to acute on chronic illness -  No further blood work or tests  Acute thrombocytopenia, likely caused by underlying infection but unable to treat underlying infection with thoracentesis (and probably VATS) due to frailty and thrombocytopenia -  Scant bleeding from gums  Coag negative Staph in 1 of 2 blood cultures from time of admission, likely contaminant  DVT prophylaxis:  SCDs >> may remove for comfort Code Status:  DNR Family Communication:  Spoke with mother Michelle Oconnor this morning with Dr. Nelda Marseille and returned again around 12:30PM for additional conversations about [name] Disposition Plan:  Home with hospice tomorrow   Consultants:   PCCM  Palliative care  Procedures:  Cor trak placed Rectal tube Foley catheter PICC line 08/19/2016   Antimicrobials:  Anti-infectives    Start     Dose/Rate Route Frequency Ordered Stop   08/22/16 1145  Levofloxacin (LEVAQUIN) IVPB 250 mg  Status:  Discontinued     250 mg 50  mL/hr over 60 Minutes Intravenous Every 24 hours 08/22/16 1110 08/25/16 1246   08/19/16 1130  vancomycin (VANCOCIN) IVPB 750 mg/150 ml premix  Status:  Discontinued     750 mg 150 mL/hr over 60 Minutes Intravenous Every 24 hours 08/18/16 1234 08/21/16 0957   08/18/16 1930  piperacillin-tazobactam (ZOSYN) IVPB 3.375 g  Status:   Discontinued     3.375 g 12.5 mL/hr over 240 Minutes Intravenous Every 8 hours 08/18/16 1234 08/22/16 1110   08/18/16 1100  piperacillin-tazobactam (ZOSYN) IVPB 3.375 g     3.375 g 100 mL/hr over 30 Minutes Intravenous  Once 08/18/16 1045 08/18/16 1211   08/18/16 1100  vancomycin (VANCOCIN) IVPB 1000 mg/200 mL premix     1,000 mg 200 mL/hr over 60 Minutes Intravenous  Once 08/18/16 1045 08/18/16 1303       Subjective: Patient unable to provide history.  Platelets remain low.  Bleeding from gums.  Ongoing respiratory distress and cough productive of thick phlegm on high flow nasal canula per family.    Objective: Vitals:   08/25/16 0900 08/25/16 1000 08/25/16 1100 08/25/16 1200  BP: (!) 87/63 (!) 84/63 102/60 (!) 87/47  Pulse: 97 96 95 95  Resp: 19 (!) 21 (!) 21 17  Temp:      TempSrc:      SpO2: 99% 97% 100% 97%  Weight:      Height:        Intake/Output Summary (Last 24 hours) at 08/25/16 1415 Last data filed at 08/25/16 1200  Gross per 24 hour  Intake             1770 ml  Output              940 ml  Net              830 ml   Filed Weights   08/23/16 0500 08/24/16 0400 08/25/16 0500  Weight: 41.6 kg (91 lb 11.4 oz) 41.5 kg (91 lb 7.9 oz) 42.2 kg (93 lb 0.6 oz)    Examination:  General exam:  Thin adult female who appears more like a child and frail-appearing.  Lying in bed with eyes closed, mouth open.  Intermittent grunting/accessory muscle use HEENT:  NCAT, dry MM but mouth breathing.  Gums are bleeding Respiratory system:  Absent breath sounds on the left and diminished at the right base.  No focal rales, wheezes, or rhonchi Cardiovascular system: tachycardic, normal S1/S2. No murmurs, rubs, gallops or clicks.  Warm extremities Gastrointestinal system: Normal active bowel sounds, soft, mildly gaseous distension, groans slightly but no guarding with palpation. MSK:  Decreased tone and bulk, no lower extremity edema.  Feet in prevalon boots.  No obvious ulceration.     Neuro:      Data Reviewed: I have personally reviewed following labs and imaging studies  CBC:  Recent Labs Lab 08/20/16 0500 08/21/16 0440 08/22/16 0500 08/23/16 0315 08/24/16 0506 08/25/16 0442  WBC 41.5* 24.0* 12.1* 11.2* 11.4* 11.7*  NEUTROABS 36.9* 20.3* 9.4* 8.1*  --   --   HGB 8.1* 8.1* 7.8* 7.7* 7.9* 7.9*  HCT 24.9* 25.3* 24.0* 23.8* 24.7* 24.7*  MCV 104.6* 104.5* 102.6* 102.1* 102.5* 102.9*  PLT 66* 47* 34* 27* 26* 29*   Basic Metabolic Panel:  Recent Labs Lab 08/20/16 0500 08/21/16 0440  08/23/16 0315 08/23/16 0901 08/23/16 1622 08/24/16 0506 08/24/16 1620 08/25/16 0442  NA 134* 130*  --  130*  --   --  134*  --  134*  K 3.2* 4.0  --  2.7*  --   --  3.2*  --  2.9*  CL 106 103  --  95*  --   --  100*  --  102  CO2 24 24  --  31  --   --  31  --  29  GLUCOSE 144* 101*  --  229*  --   --  188*  --  175*  BUN 10 10  --  7  --   --  8  --  9  CREATININE 0.37* 0.42*  --  0.35*  --   --  0.31*  --  <0.30*  CALCIUM 7.6* 7.6*  --  7.4*  --   --  7.6*  --  7.4*  MG 2.5* 2.0  < > 1.9 1.7 1.7 1.8 2.4 2.3  PHOS 2.1* 3.5  < > 2.2* 2.3* 6.3* 2.3* 2.6 2.4*  < > = values in this interval not displayed. GFR: CrCl cannot be calculated (This lab value cannot be used to calculate CrCl because it is not a number: <0.30). Liver Function Tests:  Recent Labs Lab 08/18/16 1602 08/21/16 0941  AST 20 16  ALT 6* 8*  ALKPHOS 59 81  BILITOT 0.5 0.4  PROT 6.4* 5.6*  ALBUMIN 1.6* 1.2*   No results for input(s): LIPASE, AMYLASE in the last 168 hours.  Recent Labs Lab 08/21/16 0942  AMMONIA 25   Coagulation Profile:  Recent Labs Lab 08/18/16 1602  INR 1.45   Cardiac Enzymes: No results for input(s): CKTOTAL, CKMB, CKMBINDEX, TROPONINI in the last 168 hours. BNP (last 3 results) No results for input(s): PROBNP in the last 8760 hours. HbA1C: No results for input(s): HGBA1C in the last 72 hours. CBG:  Recent Labs Lab 08/24/16 1156 08/24/16 1551  08/24/16 1948 08/25/16 0344 08/25/16 0802  GLUCAP 148* 177* 167* 181* 134*   Lipid Profile: No results for input(s): CHOL, HDL, LDLCALC, TRIG, CHOLHDL, LDLDIRECT in the last 72 hours. Thyroid Function Tests: No results for input(s): TSH, T4TOTAL, FREET4, T3FREE, THYROIDAB in the last 72 hours. Anemia Panel: No results for input(s): VITAMINB12, FOLATE, FERRITIN, TIBC, IRON, RETICCTPCT in the last 72 hours. Urine analysis:    Component Value Date/Time   COLORURINE YELLOW 08/21/2016 1136   APPEARANCEUR CLEAR 08/21/2016 1136   LABSPEC 1.020 08/21/2016 1136   PHURINE 5.5 08/21/2016 1136   GLUCOSEU NEGATIVE 08/21/2016 1136   HGBUR NEGATIVE 08/21/2016 1136   BILIRUBINUR NEGATIVE 08/21/2016 1136   KETONESUR NEGATIVE 08/21/2016 1136   PROTEINUR NEGATIVE 08/21/2016 1136   UROBILINOGEN 1.0 05/06/2010 0329   NITRITE NEGATIVE 08/21/2016 1136   LEUKOCYTESUR NEGATIVE 08/21/2016 1136   Sepsis Labs: @LABRCNTIP (procalcitonin:4,lacticidven:4)  ) Recent Results (from the past 240 hour(s))  Respiratory Panel by PCR     Status: None   Collection Time: 08/18/16 11:09 AM  Result Value Ref Range Status   Adenovirus NOT DETECTED NOT DETECTED Final   Coronavirus 229E NOT DETECTED NOT DETECTED Final   Coronavirus HKU1 NOT DETECTED NOT DETECTED Final   Coronavirus NL63 NOT DETECTED NOT DETECTED Final   Coronavirus OC43 NOT DETECTED NOT DETECTED Final   Metapneumovirus NOT DETECTED NOT DETECTED Final   Rhinovirus / Enterovirus NOT DETECTED NOT DETECTED Final   Influenza A NOT DETECTED NOT DETECTED Final   Influenza B NOT DETECTED NOT DETECTED Final   Parainfluenza Virus 1 NOT DETECTED NOT DETECTED Final   Parainfluenza Virus 2 NOT DETECTED NOT DETECTED Final   Parainfluenza  Virus 3 NOT DETECTED NOT DETECTED Final   Parainfluenza Virus 4 NOT DETECTED NOT DETECTED Final   Respiratory Syncytial Virus NOT DETECTED NOT DETECTED Final   Bordetella pertussis NOT DETECTED NOT DETECTED Final    Chlamydophila pneumoniae NOT DETECTED NOT DETECTED Final   Mycoplasma pneumoniae NOT DETECTED NOT DETECTED Final  Blood Culture (routine x 2)     Status: None   Collection Time: 08/18/16 11:20 AM  Result Value Ref Range Status   Specimen Description BLOOD RIGHT HAND  Final   Special Requests BOTTLES DRAWN AEROBIC AND ANAEROBIC  5CC  Final   Culture NO GROWTH 5 DAYS  Final   Report Status 08/23/2016 FINAL  Final  Blood Culture (routine x 2)     Status: Abnormal   Collection Time: 08/18/16 11:30 AM  Result Value Ref Range Status   Specimen Description BLOOD RIGHT ARM  Final   Special Requests BOTTLES DRAWN AEROBIC AND ANAEROBIC  5CC  Final   Culture  Setup Time   Final    GRAM POSITIVE COCCI IN CLUSTERS AEROBIC BOTTLE ONLY CRITICAL RESULT CALLED TO, READ BACK BY AND VERIFIED WITH: A. JOHNSTON PHARMD, AT 08/19/16 BY D. VANHOOK    Culture (A)  Final    STAPHYLOCOCCUS SPECIES (COAGULASE NEGATIVE) THE SIGNIFICANCE OF ISOLATING THIS ORGANISM FROM A SINGLE SET OF BLOOD CULTURES WHEN MULTIPLE SETS ARE DRAWN IS UNCERTAIN. PLEASE NOTIFY THE MICROBIOLOGY DEPARTMENT WITHIN ONE WEEK IF SPECIATION AND SENSITIVITIES ARE REQUIRED.    Report Status 08/21/2016 FINAL  Final  Blood Culture ID Panel (Reflexed)     Status: Abnormal   Collection Time: 08/18/16 11:30 AM  Result Value Ref Range Status   Enterococcus species NOT DETECTED NOT DETECTED Final   Listeria monocytogenes NOT DETECTED NOT DETECTED Final   Staphylococcus species DETECTED (A) NOT DETECTED Final    Comment: Methicillin (oxacillin) susceptible coagulase negative staphylococcus. Possible blood culture contaminant (unless isolated from more than one blood culture draw or clinical case suggests pathogenicity). No antibiotic treatment is indicated for blood  culture contaminants. CRITICAL RESULT CALLED TO, READ BACK BY AND VERIFIED WITH: A. JOHNSTON PHARMD, AT 08/19/16 BY D. VANHOOK    Staphylococcus aureus NOT DETECTED NOT DETECTED Final    Methicillin resistance NOT DETECTED NOT DETECTED Final   Streptococcus species NOT DETECTED NOT DETECTED Final   Streptococcus agalactiae NOT DETECTED NOT DETECTED Final   Streptococcus pneumoniae NOT DETECTED NOT DETECTED Final   Streptococcus pyogenes NOT DETECTED NOT DETECTED Final   Acinetobacter baumannii NOT DETECTED NOT DETECTED Final   Enterobacteriaceae species NOT DETECTED NOT DETECTED Final   Enterobacter cloacae complex NOT DETECTED NOT DETECTED Final   Escherichia coli NOT DETECTED NOT DETECTED Final   Klebsiella oxytoca NOT DETECTED NOT DETECTED Final   Klebsiella pneumoniae NOT DETECTED NOT DETECTED Final   Proteus species NOT DETECTED NOT DETECTED Final   Serratia marcescens NOT DETECTED NOT DETECTED Final   Haemophilus influenzae NOT DETECTED NOT DETECTED Final   Neisseria meningitidis NOT DETECTED NOT DETECTED Final   Pseudomonas aeruginosa NOT DETECTED NOT DETECTED Final   Candida albicans NOT DETECTED NOT DETECTED Final   Candida glabrata NOT DETECTED NOT DETECTED Final   Candida krusei NOT DETECTED NOT DETECTED Final   Candida parapsilosis NOT DETECTED NOT DETECTED Final   Candida tropicalis NOT DETECTED NOT DETECTED Final  MRSA PCR Screening     Status: None   Collection Time: 08/18/16  9:19 PM  Result Value Ref Range Status   MRSA by PCR NEGATIVE  NEGATIVE Final    Comment:        The GeneXpert MRSA Assay (FDA approved for NASAL specimens only), is one component of a comprehensive MRSA colonization surveillance program. It is not intended to diagnose MRSA infection nor to guide or monitor treatment for MRSA infections.   Urine culture     Status: None   Collection Time: 08/21/16 11:08 AM  Result Value Ref Range Status   Specimen Description URINE, RANDOM  Final   Special Requests NONE  Final   Culture NO GROWTH  Final   Report Status 08/22/2016 FINAL  Final      Radiology Studies: Dg Chest Port 1 View  Result Date: 08/25/2016 CLINICAL DATA:   Seizures, cerebral palsy, respiratory failure, possible volume overload. EXAM: PORTABLE CHEST 1 VIEW COMPARISON:  Portable chest x-ray of August 24, 2016 FINDINGS: There remains complete opacification of the left hemithorax. On the right there is a moderate-sized pleural effusion. The right upper lobe is clear. The right hemidiaphragm remains obscured. The feeding tube tip lies in the region of the distal duodenum or proximal jejunum. The right-sided PICC line tip projects over the proximal SVC. IMPRESSION: Persistent opacification of the left hemithorax due to a atelectasis or fluid. Persistent moderate-sized right pleural effusion. Probable right lower lobe atelectasis or infiltrate obscured by the pleural effusion. Electronically Signed   By: David  Martinique M.D.   On: 08/25/2016 07:05   Dg Chest Port 1 View  Result Date: 08/24/2016 CLINICAL DATA:  Pneumonia. EXAM: PORTABLE CHEST 1 VIEW COMPARISON:  08/22/2016. FINDINGS: Feeding tube noted with its tip projected over the duodenum. Right PICC line in stable position. Complete opacification of the left hemithorax noted most consistent with left lung atelectasis. Associated pleural effusion cannot be excluded. Right-sided pleural effusion noted. Right base atelectasis/infiltrate cannot be excluded. No pneumothorax. Heart size cannot be evaluated due to left hemithorax opacification . IMPRESSION: 1. Feeding tube noted with its tip projected over the duodenum. Right PICC line and stable position. 2. Complete opacification of the left hemithorax most likely from left lung atelectasis. Left-sided pleural effusion cannot be excluded. 3. Right side pleural effusion, most likely layering. Infiltrate/ edema in the right base cannot be excluded. Electronically Signed   By: Marcello Moores  Register   On: 08/24/2016 06:38     Scheduled Meds: . chlorhexidine  15 mL Mouth Rinse BID  . LORazepam  0.5 mg Oral TID  . mouth rinse  15 mL Mouth Rinse q12n4p   Continuous Infusions:    LOS: 7 days    Time spent: 30 min    Janece Canterbury, MD [facility] Pager [phone]  If 7PM-7AM, please contact night-coverage [email or web address] Password [email or web address] 08/25/2016, 2:15 PM

## 2016-08-25 NOTE — Progress Notes (Signed)
Family spoke to Dr. Sheran Fava as well as Palliative care and Case Management about the plan of care. Home Hospice will be coming shortly to discuss the upcoming care and transfer. Pt seems comfortable after Morphine was given prior. Per Palliative not more Blood sugar checks, remove Cortrak and no need to monitor VS. Family appropriate for situation, asking appropriate questions. Answers and education are given, as well as emotional support. Classic Music playing therapeutically. Mother refusing to change pt's position at this time. Education provided about the risks, but she states: "I just want her to be comfortable. I will let you know when I will need it" Will provide for the family what ever they need any.

## 2016-08-25 NOTE — Progress Notes (Signed)
Pt transferred to the 5W02. Family at the bedside with belongings. Pt seems comfortable. Emotional support was given to patient and family.

## 2016-08-25 NOTE — Consult Note (Signed)
Consultation Note Date: 08/25/2016   Patient Name: Michelle Oconnor  DOB: [birth year]  MRN: [id number]  Age / Sex: 36 y.o., female  PCP: Haywood Pao, MD Referring Physician: Janece Canterbury, MD  Reason for Consultation: Establishing goals of care, Hospice Evaluation, Non pain symptom management and Psychosocial/spiritual support  HPI/Patient Profile: 36 y.o. female  with past medical history of Cerebral Palsy and significant seizures admitted on 08/18/2016 with SOB and respiratory failure.   Clinical Assessment and Goals of Care: I met today at Carmesha's bedside with mother Velva Harman, sister Darol Destine, and aunt who is an Therapist, sports. Velva Harman shares that Kaleigha is their "little angel" and has been such a blessing to them. Velva Harman says Kihanna loves classical music and couches (if they visited someone who did not have a couch she would insist on leaving)!  We spoke more about Sandar's current health. They say that she has been coughing some today. Cough seems weak but they have been able to suction some out. Velva Harman and [place] want to make sure that they are not jumping to comfort too soon. We reviewed her CXR together to show the severity of her disease. Her aunt especially became tearful but says "I needed to see that." She confirms to them that Herma will not be able to overcome this. They are appropriately tearful and supportive of each other.   They are very interested in getting [place] home ASAP to "love on her" during her last days. We discussed use of sublingual morphine for SOB and this may be titrated as well as scheduled sublingual ativan for seizure prevention. They have Valium suppository as a plan B if she has a seizure. Hospice to help titrate and manage medications. All questions/concerns addressed. Emotional support provided. Aunt will call their pastor for additional support.   Primary Decision Maker NEXT OF KIN  mother Zahriyah Joo    SUMMARY OF RECOMMENDATIONS   - Full comfort care - Home with hospice  Code Status/Advance Care Planning:  DNR   Symptom Management:   SOB: Roxanol 5 mg every hour prn.   Seizures: Lorazepam 1 mg concentrated solution SL TID. Valium supp if has seizure once home.   Palliative Prophylaxis:   Aspiration, Bowel Regimen, Delirium Protocol, Frequent Pain Assessment, Oral Care and Turn Reposition  Additional Recommendations (Limitations, Scope, Preferences):  Full Comfort Care  Psycho-social/Spiritual:   Desire for further Chaplaincy support:no  Additional Recommendations: Caregiving  Support/Resources, Education on Hospice and Grief/Bereavement Support  Prognosis:   < 2 weeks  Discharge Planning: Home with Hospice      Primary Diagnoses: Present on Admission: . Respiratory failure ([place])   I have reviewed the medical record, interviewed the patient and family, and examined the patient. The following aspects are pertinent.  Past Medical History:  Diagnosis Date  . Cerebral palsy ([place])   . Seizures ([place])    has seizures monthly.  Dr Marlou Sa   [phone]    is her        Social History   Social History  .  Marital status: Single    Spouse name: N/A  . Number of children: N/A  . Years of education: N/A   Social History Main Topics  . Smoking status: Never Smoker  . Smokeless tobacco: Never Used  . Alcohol use No  . Drug use: No  . Sexual activity: No     Comment: c.p.   Other Topics Concern  . None   Social History Narrative  . None   No family history on file. Scheduled Meds: . chlorhexidine  15 mL Mouth Rinse BID  . LORazepam  1 mg Intravenous TID   Or  . LORazepam  1 mg Oral TID  . mouth rinse  15 mL Mouth Rinse q12n4p   Continuous Infusions: PRN Meds:.sodium chloride, LORazepam, morphine injection, morphine CONCENTRATE, sodium chloride flush Allergies  Allergen Reactions  . Doxycycline Hyclate Swelling    REACTION: tongue  swelling   Review of Systems  Unable to perform ROS: Acuity of condition    Physical Exam  Constitutional: She appears cachectic.  Cardiovascular: Normal rate.   Pulmonary/Chest: Effort normal. No accessory muscle usage. No apnea and no tachypnea. No respiratory distress.  Abdominal: Normal appearance.  Neurological: She is unresponsive.  Skin:  Abrasion on nose from BiPAP    Vital Signs: BP (!) 87/47   Pulse 96   Temp 99.6 F (37.6 C) (Axillary)   Resp 17   Ht '4\' 11"'  (1.499 m)   Wt 42.2 kg (93 lb 0.6 oz)   SpO2 98%   BMI 18.79 kg/m  Pain Assessment: CPOT       SpO2: SpO2: 98 % O2 Device:SpO2: 98 % O2 Flow Rate: .O2 Flow Rate (L/min): 5 L/min  IO: Intake/output summary:  Intake/Output Summary (Last 24 hours) at 08/25/16 1451 Last data filed at 08/25/16 1200  Gross per 24 hour  Intake             1770 ml  Output              940 ml  Net              830 ml    LBM: Last BM Date: 08/25/16 Baseline Weight: Weight: 38.6 kg (85 lb) Most recent weight: Weight: 42.2 kg (93 lb 0.6 oz)     Palliative Assessment/Data:     Time Total: 63mn  Greater than 50%  of this time was spent counseling and coordinating care related to the above assessment and plan.  Signed by: AVinie Sill NP Palliative Medicine Team Pager # [phone](M-F 8a-5p) Team Phone # [phone](Nights/Weekends)

## 2016-08-25 NOTE — Care Management Note (Addendum)
Case Management Note  Patient Details  Name: CHANA LINDSTROM MRN: [id number] Date of Birth: 17-Feb-1981  Subjective/Objective:   Pt admitted with RF and AMS                 Action/Plan:   36 year old female with CP who is severely debilitated. Requires continuous BIPAP - very high risk for intubation.  No family at bedside.  CM will continue to monitor for discharge needs   Expected Discharge Date:                  Expected Discharge Plan:  [place]  In-House Referral:  Hospice / Palliative Care  Discharge planning Services  CM Consult  Post Acute Care Choice:    Choice offered to:     DME Arranged:    DME Agency:     HH Arranged:    [place] Agency:     Status of Service:  In process, will continue to follow  If discussed at Long Length of Stay Meetings, dates discussed:    Additional Comments: 08/25/2016  Agency accepted referral,. Equipment was to be delivered to home evening of 08/25/16.  [facility] called with a tenative pick up at noon.  Pt transferred to 5W post arrangements being made.  5W CM informed of plan and will followup with Hopsice and [facility] to ensure smooth transition home.  [facility].  Pt's mom Velva Harman was offered choice and she chose above agency.  CM contacted agency and referral was accepted - liaison to follow up with family today.  CM contacted [facility] and arranged tentative pick up tomorrow at noon. Maryclare Labrador, RN 08/25/2016, 2:46 PM

## 2016-08-25 NOTE — Progress Notes (Signed)
K+ 2.9, repleted with 79meq oral soln.

## 2016-08-25 NOTE — Progress Notes (Signed)
Pt's family asked to speak to Dr. Sheran Fava. MD called and notified. Will come shortly

## 2016-08-26 DIAGNOSIS — A419 Sepsis, unspecified organism: Secondary | ICD-10-CM

## 2016-08-26 DIAGNOSIS — E876 Hypokalemia: Secondary | ICD-10-CM

## 2016-08-26 DIAGNOSIS — R6521 Severe sepsis with septic shock: Secondary | ICD-10-CM

## 2016-08-26 DIAGNOSIS — J90 Pleural effusion, not elsewhere classified: Secondary | ICD-10-CM

## 2016-08-26 DIAGNOSIS — D696 Thrombocytopenia, unspecified: Secondary | ICD-10-CM

## 2016-08-26 DIAGNOSIS — J189 Pneumonia, unspecified organism: Secondary | ICD-10-CM

## 2016-08-26 MED ORDER — LORAZEPAM 2 MG/ML PO CONC: ORAL | 0 refills | Status: AC

## 2016-08-26 MED ORDER — MORPHINE SULFATE (CONCENTRATE) 20 MG/ML PO SOLN: 5.0000 mg | ORAL | 0 refills | Status: AC | PRN

## 2016-08-26 NOTE — Progress Notes (Signed)
Pt and mother resting comfortably in room. Mother requested that pt not be fully assessed at this time.

## 2016-08-26 NOTE — Progress Notes (Signed)
Spoke with patient's mother, Velva Harman, she confirmed that the equipment has been delivered to the house and knows to expect [facility] today at 12:00. CM confirmed with [facility] [phone] tentitive pick up time of 12:00 today for transport home and updated on room change to 5W2. Shawn Route RN at bedside of DC plan for today.

## 2016-08-26 NOTE — Care Management Important Message (Signed)
Important Message  Patient Details  Name: Michelle Oconnor MRN: [id number] Date of Birth: 1980-07-30   Medicare Important Message Given:  Yes    Orbie Pyo 08/26/2016, 3:59 PM

## 2016-08-26 NOTE — Progress Notes (Signed)
Eulis Manly to be D/C'd Home with Hospice per MD order.  Discussed with the patient and all questions fully answered.  IV catheter discontinued intact. Site without signs and symptoms of complications. Dressing and pressure applied.  An After Visit Summary was printed and given to the patient. Patient received prescription.  Patient escorted via [facility], and D/C'd home via private auto.  Christoper Fabian Christina Gintz 08/26/2016 12:43 PM

## 2016-08-26 NOTE — Progress Notes (Signed)
At change of shift pt family enquire about scheduled ativan.  Off going RN informed family that IV ativan is D/C'd for PO.  They demanded it changed.  RN informed family that she will let the on call NP know.  NP was paged and an order for sublingual ativan received. Family asked if RN heard from NP yet and RN narrated the current state of the matter to them but they demanded to know why it was changed when they were told that ot cannot have anything by mouth.  RN explained that because pt will not go home with IV, we are transitioning and preparing her for when she go home.  Family was not satisfied RN paged NP again.  NP called RN while RN was at the bedside and explained the situation but family was still not satisfied.  After NP hung up the phone, family sister who is a nurse called and they told her everything that was going on.  She assured that that it was fine to do sublingual.  At this point they all agreed to the sublingual.  Pt is in bed resting and family are happy.  Will continue to monitor.

## 2016-08-26 NOTE — Progress Notes (Signed)
Text paged Dr Sheran Fava to notify home hospice requested DC sum to be completed. They will follow in Epic for completed note.

## 2016-08-26 NOTE — Discharge Instructions (Signed)
Community-Acquired Pneumonia, Adult °Pneumonia is an infection of the lungs. There are different types of pneumonia. One type can develop while a person is in a hospital. A different type, called community-acquired pneumonia, develops in people who are not, or have not recently been, in the hospital or other health care facility. °What are the causes? °Pneumonia may be caused by bacteria, viruses, or funguses. Community-acquired pneumonia is often caused by Streptococcus pneumonia bacteria. These bacteria are often passed from one person to another by breathing in droplets from the cough or sneeze of an infected person. °What increases the risk? °The condition is more likely to develop in: °· People who have chronic diseases, such as chronic obstructive pulmonary disease (COPD), asthma, congestive heart failure, cystic fibrosis, diabetes, or kidney disease. °· People who have early-stage or late-stage HIV. °· People who have sickle cell disease. °· People who have had their spleen removed (splenectomy). °· People who have poor dental hygiene. °· People who have medical conditions that increase the risk of breathing in (aspirating) secretions their own mouth and nose. °· People who have a weakened immune system (immunocompromised). °· People who smoke. °· People who travel to areas where pneumonia-causing germs commonly exist. °· People who are around animal habitats or animals that have pneumonia-causing germs, including birds, bats, rabbits, cats, and farm animals. ° °What are the signs or symptoms? °Symptoms of this condition include: °· A dry cough. °· A wet (productive) cough. °· Fever. °· Sweating. °· Chest pain, especially when breathing deeply or coughing. °· Rapid breathing or difficulty breathing. °· Shortness of breath. °· Shaking chills. °· Fatigue. °· Muscle aches. ° °How is this diagnosed? °Your health care provider will take a medical history and perform a physical exam. You may also have other tests,  including: °· Imaging studies of your chest, including X-rays. °· Tests to check your blood oxygen level and other blood gases. °· Other tests on blood, mucus (sputum), fluid around your lungs (pleural fluid), and urine. ° °If your pneumonia is severe, other tests may be done to identify the specific cause of your illness. °How is this treated? °The type of treatment that you receive depends on many factors, such as the cause of your pneumonia, the medicines you take, and other medical conditions that you have. For most adults, treatment and recovery from pneumonia may occur at home. In some cases, treatment must happen in a hospital. Treatment may include: °· Antibiotic medicines, if the pneumonia was caused by bacteria. °· Antiviral medicines, if the pneumonia was caused by a virus. °· Medicines that are given by mouth or through an IV tube. °· Oxygen. °· Respiratory therapy. ° °Although rare, treating severe pneumonia may include: °· Mechanical ventilation. This is done if you are not breathing well on your own and you cannot maintain a safe blood oxygen level. °· Thoracentesis. This procedure removes fluid around one lung or both lungs to help you breathe better. ° °Follow these instructions at home: °· Take over-the-counter and prescription medicines only as told by your health care provider. °? Only take cough medicine if you are losing sleep. Understand that cough medicine can prevent your body’s natural ability to remove mucus from your lungs. °? If you were prescribed an antibiotic medicine, take it as told by your health care provider. Do not stop taking the antibiotic even if you start to feel better. °· Sleep in a semi-upright position at night. Try sleeping in a reclining chair, or place a few pillows under your head. °· Do not use tobacco products, including cigarettes, chewing   tobacco, and e-cigarettes. If you need help quitting, ask your health care provider. °· Drink enough water to keep your urine  clear or pale yellow. This will help to thin out mucus secretions in your lungs. °How is this prevented? °There are ways that you can decrease your risk of developing community-acquired pneumonia. Consider getting a pneumococcal vaccine if: °· You are older than 38 years of age. °· You are older than 38 years of age and are undergoing cancer treatment, have chronic lung disease, or have other medical conditions that affect your immune system. Ask your health care provider if this applies to you. ° °There are different types and schedules of pneumococcal vaccines. Ask your health care provider which vaccination option is best for you. °You may also prevent community-acquired pneumonia if you take these actions: °· Get an influenza vaccine every year. Ask your health care provider which type of influenza vaccine is best for you. °· Go to the dentist on a regular basis. °· Wash your hands often. Use hand sanitizer if soap and water are not available. ° °Contact a health care provider if: °· You have a fever. °· You are losing sleep because you cannot control your cough with cough medicine. °Get help right away if: °· You have worsening shortness of breath. °· You have increased chest pain. °· Your sickness becomes worse, especially if you are an older adult or have a weakened immune system. °· You cough up blood. °This information is not intended to replace advice given to you by your health care provider. Make sure you discuss any questions you have with your health care provider. °Document Released: 05/09/2005 Document Revised: 09/17/2015 Document Reviewed: 09/03/2014 °[employer] Interactive Patient Education © 2017 [employer] ° °

## 2016-08-26 NOTE — Discharge Summary (Addendum)
Physician Discharge Summary  Michelle Oconnor QZR:[id number] DOB: 12/09/1980 DOA: 08/18/2016  PCP: Haywood Pao, MD  Admit date: 08/18/2016 Discharge date: 08/26/2016  Admitted From: home  Disposition:  Home with hospice  Home Health:  Home hospice Equipment/Devices:  Home oxygen 3L  Discharge Condition:  Stable, improved CODE STATUS:  DNR  Diet recommendation:  NPO unless fully awake and sitting up   Brief/Interim Summary:  The patient is a 36 year old female with history of CP, epilepsy, who is taking care of by her mother who presented to the hospital with a 2 day history of change in behavior and increased shortness of breath. In the emergency department, she was placed on BiPAP and given IV fluids for hypotension.  Her chest x-ray demonstrated bilateral pneumonia with complete opacification of the left lung.  She was admitted to the ICU and placed on broad-spectrum antibiotics for pneumonia.  Due to progressive and worsening thrombocytopenia, she was unable to undergo thoracentesis for diagnostic and therapeutic purposes.  She initially required vasopressors which were titrated off however she remained hypotensive in the 35K to 56Y systolic. She was given intermittent Lasix which resulted in some diuresis but she became hypotensive to the 56L systolic. She was able to transition from BiPAP to high flow nasal cannula after 6 days. Critical care had repeated conversations about goals of care with the patient's family, and the decision was made on 4/5 to transition to comfort measures because it is unlikely she will recover from this infection. Family is sad but understanding.  They have elected to take her home with hospice.  Discharge Diagnoses:  Principal Problem:   Severe sepsis with septic shock ([place]) Active Problems:   Acute respiratory failure (HCC)   Pressure injury of skin   Volume overload   Goals of care, counseling/discussion   Palliative care encounter   Pleural effusion  on left   Thrombocytopenia (HCC)   Hypokalemia   Community acquired pneumonia  Acute hypoxic respiratory failure and septic shock secondary to severe community acquired pneumonia with large left pleural effusion.  Fevers, leukocytosis have improved, but persistent metabolic encephalopathy, tachypnea, hypotension.  She will be going home with hospice for comfort care.  Prognosis is days to weeks.  Anticipate she will have progressive fevers and shortness of breath.   -  Transitioned to nasal canula prior to discharge -  Unable to perform thoracentesis secondary to persistent thrombocytopenia -  Discontinued levofloxacin after goals of care conversation on 4/5 -  No Lasix secondary to hypotension -  Palliative care was consulted and assisted with medication management and additional goals of care clarification. -  Would recommend rectal tylenol if needed if fevers develop -  Morphine sublingual for SOB and pain   -  Bisacodyl suppositories for constipation if needed  Dysphagia.  She had an NG placed which was removed after goals of care conversation.   -  Unable to tolerate oral medications or food -  Transitioned to sublingual medications  Generalized weakness -  She is bedbound  -  Discontinued foley catheter and rectal tube for comfort per family request prior to discharge  Epilepsy -  Case discussed with on-call neurologist T. Shon Hale and with palliative care -  Patient will not be able to take her Zonegran and valproic acid  -  Started scheduled SL ativan 1mg  TID to prevent seizures -  Continue prn diastat  Macrocytic anemia, likely due to acute on chronic illness -  No further blood work or tests  Acute thrombocytopenia, likely caused by underlying infection but unable to treat underlying infection with thoracentesis (and probably VATS) due to frailty and thrombocytopenia -  Scant bleeding from gums  Coag negative Staph in 1 of 2 blood cultures from time of admission,  likely contaminant  Discharge Instructions  Discharge Instructions    Bed rest    Complete by:  As directed    Discharge instructions    Complete by:  As directed    Call hospice if you have any questions or concerns.   PICC line removal    Complete by:  As directed        Medication List    STOP taking these medications   divalproex 125 MG capsule Commonly known as:  DEPAKOTE SPRINKLE   glycopyrrolate 1 MG tablet Commonly known as:  ROBINUL   ibuprofen 200 MG tablet Commonly known as:  ADVIL,MOTRIN   lactulose 10 GM/15ML solution Commonly known as:  CHRONULAC   LORazepam 1 MG tablet Commonly known as:  ATIVAN Replaced by:  LORazepam 2 MG/ML concentrated solution   zonisamide 100 MG capsule Commonly known as:  ZONEGRAN     TAKE these medications   diazepam 10 MG Gel Commonly known as:  DIASTAT ACUDIAL Place 10 mg rectally every 6 (six) hours as needed. Post-seizure if Lorazepam does not subside anxiety, per pt's mother   LORazepam 2 MG/ML concentrated solution Commonly known as:  ATIVAN 1 mg under the tongue every 8 hours.  May also use three times daily as needed for agitation or seizures Replaces:  LORazepam 1 MG tablet   morphine 20 MG/ML concentrated solution Commonly known as:  ROXANOL Place 0.25 mLs (5 mg total) under the tongue every hour as needed for moderate pain, severe pain or shortness of breath.      Follow-up Information    Haywood Pao, MD Follow up.   Specialty:  Internal Medicine Contact information: [place] [address] [phone]          Allergies  Allergen Reactions  . Doxycycline Hyclate Swelling    REACTION: tongue swelling    Consultations: PCCM Palliative care   Procedures/Studies: Dg Chest Port 1 View  Result Date: 08/25/2016 CLINICAL DATA:  Seizures, cerebral palsy, respiratory failure, possible volume overload. EXAM: PORTABLE CHEST 1 VIEW COMPARISON:  Portable chest x-ray of August 24, 2016 FINDINGS: There remains complete opacification of the left hemithorax. On the right there is a moderate-sized pleural effusion. The right upper lobe is clear. The right hemidiaphragm remains obscured. The feeding tube tip lies in the region of the distal duodenum or proximal jejunum. The right-sided PICC line tip projects over the proximal SVC. IMPRESSION: Persistent opacification of the left hemithorax due to a atelectasis or fluid. Persistent moderate-sized right pleural effusion. Probable right lower lobe atelectasis or infiltrate obscured by the pleural effusion. Electronically Signed   By: David  Martinique M.D.   On: 08/25/2016 07:05   Dg Chest Port 1 View  Result Date: 08/24/2016 CLINICAL DATA:  Pneumonia. EXAM: PORTABLE CHEST 1 VIEW COMPARISON:  08/22/2016. FINDINGS: Feeding tube noted with its tip projected over the duodenum. Right PICC line in stable position. Complete opacification of the left hemithorax noted most consistent with left lung atelectasis. Associated pleural effusion cannot be excluded. Right-sided pleural effusion noted. Right base atelectasis/infiltrate cannot be excluded. No pneumothorax. Heart size cannot be evaluated due to left hemithorax opacification . IMPRESSION: 1. Feeding tube noted with its tip projected over the duodenum. Right PICC  line and stable position. 2. Complete opacification of the left hemithorax most likely from left lung atelectasis. Left-sided pleural effusion cannot be excluded. 3. Right side pleural effusion, most likely layering. Infiltrate/ edema in the right base cannot be excluded. Electronically Signed   By: Marcello Moores  Register   On: 08/24/2016 06:38   Dg Chest Port 1 View  Result Date: 08/22/2016 CLINICAL DATA:  Fever and recent weight gain EXAM: PORTABLE CHEST 1 VIEW COMPARISON:  August 21, 2016 FINDINGS: Central catheter tip is in the superior vena cava. No pneumothorax. There is persistent right pleural effusion. There is airspace consolidation in the  lower lung regions bilaterally, somewhat more on the left than on the right. Heart is upper normal in size with pulmonary vascularity within normal limits. No adenopathy. No bone lesions. IMPRESSION: Space consolidation consistent with pneumonia both lower lobes, more on the left on the right. Stable moderate pleural effusion on the right. Stable cardiac silhouette. Central catheter tip in superior vena cava without pneumothorax. Electronically Signed   By: Lowella Grip III M.D.   On: 08/22/2016 09:39   Dg Chest Port 1 View  Result Date: 08/21/2016 CLINICAL DATA:  Acute respiratory failure EXAM: PORTABLE CHEST 1 VIEW COMPARISON:  08/20/2016 FINDINGS: Extensive bibasilar infiltrate unchanged. Bilateral pleural effusions unchanged Right arm PICC tip in the SVC unchanged. IMPRESSION: Bibasilar infiltrate and effusion unchanged. Electronically Signed   By: Franchot Gallo M.D.   On: 08/21/2016 07:09   Dg Chest Port 1 View  Result Date: 08/20/2016 CLINICAL DATA:  Respiratory failure EXAM: PORTABLE CHEST 1 VIEW COMPARISON:  Chest radiograph from one day prior. FINDINGS: Right PICC terminates in the middle third of the superior vena cava. Stable cardiomediastinal silhouette with normal heart size. No pneumothorax. Small bilateral pleural effusions appear increased bilaterally, although this may be due to differences in positioning. No pulmonary edema. Patchy bibasilar lung opacities, worsened on the right and stable on the left. IMPRESSION: 1. Small bilateral pleural effusions appear increased bilaterally, possibly due to differences in positioning. 2. Patchy bibasilar lung opacities, worsened on the right, suspicious for aspiration or pneumonia. Electronically Signed   By: Ilona Sorrel M.D.   On: 08/20/2016 20:41   Dg Chest Port 1 View  Result Date: 08/19/2016 CLINICAL DATA:  Respiratory difficulty EXAM: PORTABLE CHEST 1 VIEW COMPARISON:  08/18/2016 FINDINGS: Cardiac shadow is within normal limits. The  lungs are well aerated bilaterally with patchy bibasilar infiltrates slightly increased when compared with the prior exam. The upper abdomen is within normal limits. No bony abnormality is seen. IMPRESSION: Patchy bibasilar infiltrates. Electronically Signed   By: Inez Catalina M.D.   On: 08/19/2016 07:37   Dg Chest Port 1 View  Result Date: 08/18/2016 CLINICAL DATA:  Fever and cough EXAM: PORTABLE CHEST 1 VIEW COMPARISON:  May 06, 2010 FINDINGS: There is patchy airspace opacity in the right lower lobe. Lungs elsewhere clear. Heart size and pulmonary vascularity are normal. No adenopathy. No bone lesions. IMPRESSION: Patchy infiltrate right lower lobe consistent with pneumonia. Lungs elsewhere clear. No adenopathy. Electronically Signed   By: Lowella Grip III M.D.   On: 08/18/2016 11:06   Dg Abd Portable 1v  Result Date: 08/23/2016 CLINICAL DATA:  Encounter for feeding tube placement EXAM: PORTABLE ABDOMEN - 1 VIEW COMPARISON:  None. FINDINGS: Rounded calcification over the right upper quadrant, suspected gallstone. The feeding tube is coiled on itself with tip at the left upper quadrant. The curve in the tube is narrow, and it could be entirely  within the stomach or at the duodenal jejunal junction. Bowel gas pattern is normal. IMPRESSION: 1. Feeding tube tip in the left upper quadrant, favored at the duodenal jejunal junction. The curve in the feeding tube is narrow, such that an entire intragastric position is not excluded. If post pyloric feeding is necessary, KUB after injected water-soluble contrast could confirm placement. 2. Cholelithiasis. Electronically Signed   By: Monte Fantasia M.D.   On: 08/23/2016 12:54    Subjective:  Patient unable to provide history.  Lethargic, encephalopathic.  Family states that she was able to rest comfortably overnight and that she reacted some when her mother kissed her.    Discharge Exam: Vitals:   08/25/16 2256 08/26/16 0630  BP: (!) 92/41 (!)  100/55  Pulse: 76 79  Resp: 17 17  Temp: 97.8 F (36.6 C) 97.9 F (36.6 C)   Vitals:   08/25/16 1708 08/25/16 2256 08/26/16 0630 08/26/16 0834  BP:  (!) 92/41 (!) 100/55   Pulse:  76 79   Resp:  17 17   Temp:  97.8 F (36.6 C) 97.9 F (36.6 C)   TempSrc:  Axillary Axillary   SpO2:  100% 100% 100%  Weight: 40.8 kg (89 lb 14.4 oz)     Height: 4\' 11"  (1.499 m)      General exam:  Thin adult female who appears more like a child and frail-appearing.  Lying in bed with eyes closed, mouth open.  Intermittent grunting/accessory muscle use HEENT:  narrow bitemporal diameter.  NCAT, dry MM but mouth breathing.  Gums are bleeding, poor dentition, micrognathia Respiratory system:  Absent breath sounds on the left and diminished at the right base.  No focal rales, wheezes, or rhonchi Cardiovascular system: tachycardic, normal S1/S2. No murmurs, rubs, gallops or clicks.  Warm extremities Gastrointestinal system: Normal active bowel sounds, soft, mildly gaseous distension, nontender MSK:  Decreased tone and bulk, no lower extremity edema.  Feet no longer in prevalon boots.  No obvious ulceration.      The results of significant diagnostics from this hospitalization (including imaging, microbiology, ancillary and laboratory) are listed below for reference.     Microbiology: Recent Results (from the past 240 hour(s))  Respiratory Panel by PCR     Status: None   Collection Time: 08/18/16 11:09 AM  Result Value Ref Range Status   Adenovirus NOT DETECTED NOT DETECTED Final   Coronavirus 229E NOT DETECTED NOT DETECTED Final   Coronavirus HKU1 NOT DETECTED NOT DETECTED Final   Coronavirus NL63 NOT DETECTED NOT DETECTED Final   Coronavirus OC43 NOT DETECTED NOT DETECTED Final   Metapneumovirus NOT DETECTED NOT DETECTED Final   Rhinovirus / Enterovirus NOT DETECTED NOT DETECTED Final   Influenza A NOT DETECTED NOT DETECTED Final   Influenza B NOT DETECTED NOT DETECTED Final   Parainfluenza  Virus 1 NOT DETECTED NOT DETECTED Final   Parainfluenza Virus 2 NOT DETECTED NOT DETECTED Final   Parainfluenza Virus 3 NOT DETECTED NOT DETECTED Final   Parainfluenza Virus 4 NOT DETECTED NOT DETECTED Final   Respiratory Syncytial Virus NOT DETECTED NOT DETECTED Final   Bordetella pertussis NOT DETECTED NOT DETECTED Final   Chlamydophila pneumoniae NOT DETECTED NOT DETECTED Final   Mycoplasma pneumoniae NOT DETECTED NOT DETECTED Final  Blood Culture (routine x 2)     Status: None   Collection Time: 08/18/16 11:20 AM  Result Value Ref Range Status   Specimen Description BLOOD RIGHT HAND  Final   Special Requests BOTTLES DRAWN  AEROBIC AND ANAEROBIC  5CC  Final   Culture NO GROWTH 5 DAYS  Final   Report Status 08/23/2016 FINAL  Final  Blood Culture (routine x 2)     Status: Abnormal   Collection Time: 08/18/16 11:30 AM  Result Value Ref Range Status   Specimen Description BLOOD RIGHT ARM  Final   Special Requests BOTTLES DRAWN AEROBIC AND ANAEROBIC  5CC  Final   Culture  Setup Time   Final    GRAM POSITIVE COCCI IN CLUSTERS AEROBIC BOTTLE ONLY CRITICAL RESULT CALLED TO, READ BACK BY AND VERIFIED WITH: A. JOHNSTON PHARMD, AT 08/19/16 BY D. VANHOOK    Culture (A)  Final    STAPHYLOCOCCUS SPECIES (COAGULASE NEGATIVE) THE SIGNIFICANCE OF ISOLATING THIS ORGANISM FROM A SINGLE SET OF BLOOD CULTURES WHEN MULTIPLE SETS ARE DRAWN IS UNCERTAIN. PLEASE NOTIFY THE MICROBIOLOGY DEPARTMENT WITHIN ONE WEEK IF SPECIATION AND SENSITIVITIES ARE REQUIRED.    Report Status 08/21/2016 FINAL  Final  Blood Culture ID Panel (Reflexed)     Status: Abnormal   Collection Time: 08/18/16 11:30 AM  Result Value Ref Range Status   Enterococcus species NOT DETECTED NOT DETECTED Final   Listeria monocytogenes NOT DETECTED NOT DETECTED Final   Staphylococcus species DETECTED (A) NOT DETECTED Final    Comment: Methicillin (oxacillin) susceptible coagulase negative staphylococcus. Possible blood culture contaminant  (unless isolated from more than one blood culture draw or clinical case suggests pathogenicity). No antibiotic treatment is indicated for blood  culture contaminants. CRITICAL RESULT CALLED TO, READ BACK BY AND VERIFIED WITH: A. JOHNSTON PHARMD, AT 08/19/16 BY D. VANHOOK    Staphylococcus aureus NOT DETECTED NOT DETECTED Final   Methicillin resistance NOT DETECTED NOT DETECTED Final   Streptococcus species NOT DETECTED NOT DETECTED Final   Streptococcus agalactiae NOT DETECTED NOT DETECTED Final   Streptococcus pneumoniae NOT DETECTED NOT DETECTED Final   Streptococcus pyogenes NOT DETECTED NOT DETECTED Final   Acinetobacter baumannii NOT DETECTED NOT DETECTED Final   Enterobacteriaceae species NOT DETECTED NOT DETECTED Final   Enterobacter cloacae complex NOT DETECTED NOT DETECTED Final   Escherichia coli NOT DETECTED NOT DETECTED Final   Klebsiella oxytoca NOT DETECTED NOT DETECTED Final   Klebsiella pneumoniae NOT DETECTED NOT DETECTED Final   Proteus species NOT DETECTED NOT DETECTED Final   Serratia marcescens NOT DETECTED NOT DETECTED Final   Haemophilus influenzae NOT DETECTED NOT DETECTED Final   Neisseria meningitidis NOT DETECTED NOT DETECTED Final   Pseudomonas aeruginosa NOT DETECTED NOT DETECTED Final   Candida albicans NOT DETECTED NOT DETECTED Final   Candida glabrata NOT DETECTED NOT DETECTED Final   Candida krusei NOT DETECTED NOT DETECTED Final   Candida parapsilosis NOT DETECTED NOT DETECTED Final   Candida tropicalis NOT DETECTED NOT DETECTED Final  MRSA PCR Screening     Status: None   Collection Time: 08/18/16  9:19 PM  Result Value Ref Range Status   MRSA by PCR NEGATIVE NEGATIVE Final    Comment:        The GeneXpert MRSA Assay (FDA approved for NASAL specimens only), is one component of a comprehensive MRSA colonization surveillance program. It is not intended to diagnose MRSA infection nor to guide or monitor treatment for MRSA infections.   Urine  culture     Status: None   Collection Time: 08/21/16 11:08 AM  Result Value Ref Range Status   Specimen Description URINE, RANDOM  Final   Special Requests NONE  Final   Culture NO GROWTH  Final   Report Status 08/22/2016 FINAL  Final     Labs: BNP (last 3 results) No results for input(s): BNP in the last 8760 hours. Basic Metabolic Panel:  Recent Labs Lab 08/20/16 0500 08/21/16 0440  08/23/16 0315 08/23/16 0901 08/23/16 1622 08/24/16 0506 08/24/16 1620 08/25/16 0442  NA 134* 130*  --  130*  --   --  134*  --  134*  K 3.2* 4.0  --  2.7*  --   --  3.2*  --  2.9*  CL 106 103  --  95*  --   --  100*  --  102  CO2 24 24  --  31  --   --  31  --  29  GLUCOSE 144* 101*  --  229*  --   --  188*  --  175*  BUN 10 10  --  7  --   --  8  --  9  CREATININE 0.37* 0.42*  --  0.35*  --   --  0.31*  --  <0.30*  CALCIUM 7.6* 7.6*  --  7.4*  --   --  7.6*  --  7.4*  MG 2.5* 2.0  < > 1.9 1.7 1.7 1.8 2.4 2.3  PHOS 2.1* 3.5  < > 2.2* 2.3* 6.3* 2.3* 2.6 2.4*  < > = values in this interval not displayed. Liver Function Tests:  Recent Labs Lab 08/21/16 0941  AST 16  ALT 8*  ALKPHOS 81  BILITOT 0.4  PROT 5.6*  ALBUMIN 1.2*   No results for input(s): LIPASE, AMYLASE in the last 168 hours.  Recent Labs Lab 08/21/16 0942  AMMONIA 25   CBC:  Recent Labs Lab 08/20/16 0500 08/21/16 0440 08/22/16 0500 08/23/16 0315 08/24/16 0506 08/25/16 0442  WBC 41.5* 24.0* 12.1* 11.2* 11.4* 11.7*  NEUTROABS 36.9* 20.3* 9.4* 8.1*  --   --   HGB 8.1* 8.1* 7.8* 7.7* 7.9* 7.9*  HCT 24.9* 25.3* 24.0* 23.8* 24.7* 24.7*  MCV 104.6* 104.5* 102.6* 102.1* 102.5* 102.9*  PLT 66* 47* 34* 27* 26* 29*   Cardiac Enzymes: No results for input(s): CKTOTAL, CKMB, CKMBINDEX, TROPONINI in the last 168 hours. BNP: Invalid input(s): POCBNP CBG:  Recent Labs Lab 08/24/16 1156 08/24/16 1551 08/24/16 1948 08/25/16 0344 08/25/16 0802  GLUCAP 148* 177* 167* 181* 134*   D-Dimer No results for input(s):  DDIMER in the last 72 hours. Hgb A1c No results for input(s): HGBA1C in the last 72 hours. Lipid Profile No results for input(s): CHOL, HDL, LDLCALC, TRIG, CHOLHDL, LDLDIRECT in the last 72 hours. Thyroid function studies No results for input(s): TSH, T4TOTAL, T3FREE, THYROIDAB in the last 72 hours.  Invalid input(s): FREET3 Anemia work up No results for input(s): VITAMINB12, FOLATE, FERRITIN, TIBC, IRON, RETICCTPCT in the last 72 hours. Urinalysis    Component Value Date/Time   COLORURINE YELLOW 08/21/2016 1136   APPEARANCEUR CLEAR 08/21/2016 1136   LABSPEC 1.020 08/21/2016 1136   PHURINE 5.5 08/21/2016 1136   GLUCOSEU NEGATIVE 08/21/2016 1136   HGBUR NEGATIVE 08/21/2016 1136   BILIRUBINUR NEGATIVE 08/21/2016 1136   KETONESUR NEGATIVE 08/21/2016 1136   PROTEINUR NEGATIVE 08/21/2016 1136   UROBILINOGEN 1.0 05/06/2010 0329   NITRITE NEGATIVE 08/21/2016 1136   LEUKOCYTESUR NEGATIVE 08/21/2016 1136   Sepsis Labs Invalid input(s): PROCALCITONIN,  WBC,  LACTICIDVEN   Time coordinating discharge: Over 30 minutes  SIGNED:   Janece Canterbury, MD  [facility] 08/26/2016, 2:58 PM Pager   If 7PM-7AM,  please contact night-coverage [email or web address] Password [email or web address]

## 2016-09-20 DEATH — deceased

## 2019-01-08 IMAGING — CR DG CHEST 1V PORT
1 series · 1 of 1 positions shown · non-contrast
Comparison: 08/22/2016.

CLINICAL DATA: Pneumonia.

EXAM:
PORTABLE CHEST 1 VIEW

[AP]
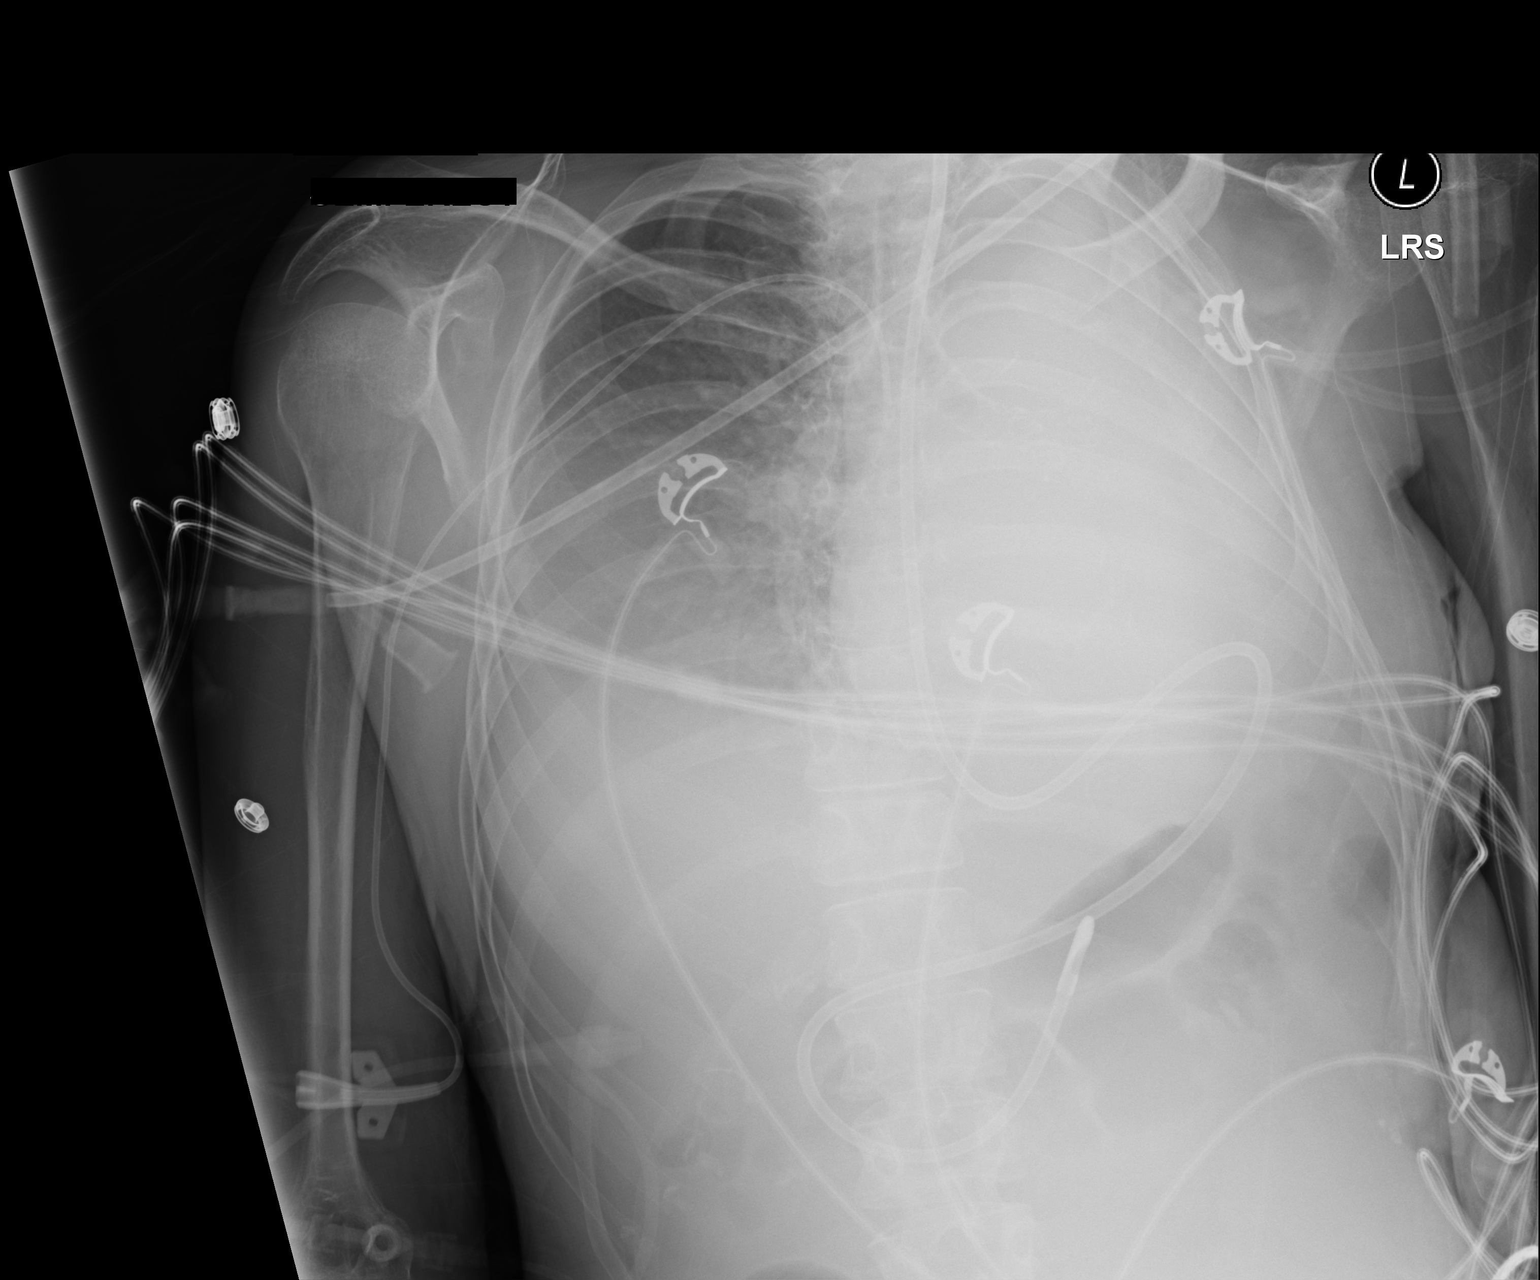

[1 of 1 positions shown; findings below may reference images not displayed]

FINDINGS: Feeding tube noted with its tip projected over the duodenum. Right
PICC line in stable position. Complete opacification of the left
hemithorax noted most consistent with left lung atelectasis.
Associated pleural effusion cannot be excluded. Right-sided pleural
effusion noted. Right base atelectasis/infiltrate cannot be
excluded. No pneumothorax. Heart size cannot be evaluated due to
left hemithorax opacification .
IMPRESSION: 1. Feeding tube noted with its tip projected over the duodenum.
Right PICC line and stable position.

2. Complete opacification of the left hemithorax most likely from
left lung atelectasis. Left-sided pleural effusion cannot be
excluded.

3. Right side pleural effusion, most likely layering. Infiltrate/
edema in the right base cannot be excluded.
